# Patient Record
Sex: Female | Born: 1940
Health system: Southern US, Community
[De-identification: ages and names within clinical notes are randomized; demographics above are authoritative.]

## PROBLEM LIST (undated history)

## (undated) DIAGNOSIS — R0600 Dyspnea, unspecified: Secondary | ICD-10-CM

## (undated) DIAGNOSIS — I10 Essential (primary) hypertension: Secondary | ICD-10-CM

## (undated) DIAGNOSIS — J189 Pneumonia, unspecified organism: Secondary | ICD-10-CM

## (undated) DIAGNOSIS — T8859XA Other complications of anesthesia, initial encounter: Secondary | ICD-10-CM

## (undated) DIAGNOSIS — R0609 Other forms of dyspnea: Secondary | ICD-10-CM

## (undated) DIAGNOSIS — T4145XA Adverse effect of unspecified anesthetic, initial encounter: Secondary | ICD-10-CM

## (undated) DIAGNOSIS — K219 Gastro-esophageal reflux disease without esophagitis: Secondary | ICD-10-CM

## (undated) DIAGNOSIS — K589 Irritable bowel syndrome without diarrhea: Secondary | ICD-10-CM

## (undated) DIAGNOSIS — R0789 Other chest pain: Secondary | ICD-10-CM

## (undated) DIAGNOSIS — Z8489 Family history of other specified conditions: Secondary | ICD-10-CM

## (undated) DIAGNOSIS — M1990 Unspecified osteoarthritis, unspecified site: Secondary | ICD-10-CM

## (undated) DIAGNOSIS — R197 Diarrhea, unspecified: Secondary | ICD-10-CM

## (undated) DIAGNOSIS — Z87448 Personal history of other diseases of urinary system: Secondary | ICD-10-CM

## (undated) DIAGNOSIS — E119 Type 2 diabetes mellitus without complications: Secondary | ICD-10-CM

## (undated) DIAGNOSIS — R350 Frequency of micturition: Secondary | ICD-10-CM

## (undated) DIAGNOSIS — J40 Bronchitis, not specified as acute or chronic: Secondary | ICD-10-CM

## (undated) HISTORY — PX: CATARACT EXTRACTION: SUR2

## (undated) HISTORY — PX: COLONOSCOPY W/ POLYPECTOMY: SHX1380

## (undated) HISTORY — DX: Other forms of dyspnea: R06.09

## (undated) HISTORY — PX: SHOULDER SURGERY: SHX246

## (undated) HISTORY — PX: NASAL SINUS SURGERY: SHX719

## (undated) HISTORY — PX: CARPAL TUNNEL RELEASE: SHX101

## (undated) HISTORY — DX: Personal history of other diseases of urinary system: Z87.448

## (undated) HISTORY — DX: Other chest pain: R07.89

## (undated) HISTORY — PX: HERNIA REPAIR: SHX51

## (undated) HISTORY — DX: Dyspnea, unspecified: R06.00

## (undated) HISTORY — DX: Pneumonia, unspecified organism: J18.9

---

## 1998-07-27 ENCOUNTER — Emergency Department (HOSPITAL_COMMUNITY): Admission: EM | Admit: 1998-07-27 | Discharge: 1998-07-27 | Payer: Self-pay | Admitting: Emergency Medicine

## 1999-05-17 ENCOUNTER — Other Ambulatory Visit: Admission: RE | Admit: 1999-05-17 | Discharge: 1999-05-17 | Payer: Self-pay | Admitting: Obstetrics and Gynecology

## 2000-07-11 ENCOUNTER — Ambulatory Visit (HOSPITAL_BASED_OUTPATIENT_CLINIC_OR_DEPARTMENT_OTHER): Admission: RE | Admit: 2000-07-11 | Discharge: 2000-07-11 | Payer: Self-pay | Admitting: Orthopedic Surgery

## 2000-08-01 ENCOUNTER — Ambulatory Visit (HOSPITAL_BASED_OUTPATIENT_CLINIC_OR_DEPARTMENT_OTHER): Admission: RE | Admit: 2000-08-01 | Discharge: 2000-08-01 | Payer: Self-pay | Admitting: Orthopedic Surgery

## 2001-02-15 ENCOUNTER — Other Ambulatory Visit: Admission: RE | Admit: 2001-02-15 | Discharge: 2001-02-15 | Payer: Self-pay | Admitting: Internal Medicine

## 2007-05-08 ENCOUNTER — Ambulatory Visit: Payer: Self-pay | Admitting: Internal Medicine

## 2007-05-15 ENCOUNTER — Encounter: Payer: Self-pay | Admitting: Internal Medicine

## 2007-05-15 ENCOUNTER — Ambulatory Visit: Payer: Self-pay | Admitting: Internal Medicine

## 2010-12-29 ENCOUNTER — Emergency Department (HOSPITAL_BASED_OUTPATIENT_CLINIC_OR_DEPARTMENT_OTHER)
Admission: EM | Admit: 2010-12-29 | Discharge: 2010-12-29 | Disposition: A | Discharge status: Home / Self Care | Payer: Medicare Other | Attending: Emergency Medicine | Admitting: Emergency Medicine

## 2010-12-29 DIAGNOSIS — R112 Nausea with vomiting, unspecified: Secondary | ICD-10-CM | POA: Insufficient documentation

## 2010-12-29 DIAGNOSIS — R509 Fever, unspecified: Secondary | ICD-10-CM | POA: Insufficient documentation

## 2010-12-29 DIAGNOSIS — I10 Essential (primary) hypertension: Secondary | ICD-10-CM | POA: Insufficient documentation

## 2010-12-29 DIAGNOSIS — R197 Diarrhea, unspecified: Secondary | ICD-10-CM | POA: Insufficient documentation

## 2010-12-29 LAB — COMPREHENSIVE METABOLIC PANEL
ALT: 28 U/L (ref 0–35)
AST: 23 U/L (ref 0–37)
Albumin: 4.7 g/dL (ref 3.5–5.2)
CO2: 25 mEq/L (ref 19–32)
Calcium: 9.7 mg/dL (ref 8.4–10.5)
Chloride: 106 mEq/L (ref 96–112)
Creatinine, Ser: 0.9 mg/dL (ref 0.4–1.2)
GFR calc Af Amer: >60 mL/min (ref >60)
GFR calc non Af Amer: >60 mL/min (ref >60)
Sodium: 145 mEq/L (ref 135–145)
Total Bilirubin: 0.8 mg/dL (ref 0.3–1.2)

## 2010-12-29 LAB — URINALYSIS, ROUTINE W REFLEX MICROSCOPIC
Bilirubin Urine: NEGATIVE
Hgb urine dipstick: NEGATIVE
Ketones, ur: NEGATIVE mg/dL
Leukocytes, UA: NEGATIVE
Nitrite: NEGATIVE
Protein, ur: 30 mg/dL — AB
Specific Gravity, Urine: 1.035 — ABNORMAL HIGH (ref 1.005–1.030)
Urine Glucose, Fasting: NEGATIVE mg/dL
Urobilinogen, UA: 0.2 mg/dL (ref 0.0–1.0)
pH: 6 (ref 5.0–8.0)

## 2010-12-29 LAB — CBC
HCT: 43.6 % (ref 36.0–46.0)
Hemoglobin: 14.9 g/dL (ref 12.0–15.0)
MCH: 29.1 pg (ref 26.0–34.0)
MCHC: 34.2 g/dL (ref 30.0–36.0)
MCV: 85.2 fL (ref 78.0–100.0)
Platelets: 152 K/uL (ref 150–400)
RBC: 5.12 MIL/uL — ABNORMAL HIGH (ref 3.87–5.11)
RDW: 14.3 % (ref 11.5–15.5)
WBC: 8.6 K/uL (ref 4.0–10.5)

## 2010-12-29 LAB — DIFFERENTIAL
Basophils Absolute: 0 K/uL (ref 0.0–0.1)
Basophils Relative: 0 % (ref 0–1)
Eosinophils Absolute: 0 K/uL (ref 0.0–0.7)
Eosinophils Relative: 1 % (ref 0–5)
Lymphocytes Relative: 2 % — ABNORMAL LOW (ref 12–46)
Lymphs Abs: 0.2 K/uL — ABNORMAL LOW (ref 0.7–4.0)
Monocytes Absolute: 0.2 K/uL (ref 0.1–1.0)
Monocytes Relative: 3 % (ref 3–12)
Neutro Abs: 8.1 K/uL — ABNORMAL HIGH (ref 1.7–7.7)
Neutrophils Relative %: 94 % — ABNORMAL HIGH (ref 43–77)

## 2010-12-29 LAB — URINE MICROSCOPIC-ADD ON

## 2011-04-01 NOTE — Op Note (Signed)
[place]  Patient:    Robin Vaughn, Robin Vaughn                      MRN: [id number] Proc. Date: 08/01/00 Adm. Date:  [id number] Attending:  Susa Day                           Operative Report  PREOPERATIVE DIAGNOSIS:  Entrapment neuropathy, median nerve, left carpal tunnel.  POSTOPERATIVE DIAGNOSIS:  Entrapment neuropathy, median nerve, left carpal tunnel.  OPERATION:  Release of left transverse carpal ligament.  SURGEON:  Katy Fitch. Sypher, Montez Hageman., M.D.  ASSISTANT:  Jonni Sanger, P.A.  ANESTHESIA:  Left proximal forearm IV regional.  SUPERVISING ANESTHESIOLOGIST:  Halford Decamp, M.D.  INDICATIONS:  [name] is a 70 year old woman who has had longstanding numbness in the median distribution on the right.  Due to failure to respond to non-operative measures, she is brought to the operating room at this time, anticipating release of her right transverse carpal ligament.  DESCRIPTION OF PROCEDURE:  [name] is brought to the operating room and placed in the supine position on the operating table.  Following placement of an IV regional block, the left arm was prepped with Betadine soap and solution and sterilely draped.  When anesthesia was satisfactory, the procedure commenced with a short incision in the line of the ring finger in the palm.  The subcutaneous tissues were carefully divided around the palmar fascia.  They were split longitudinally to reveal the common sensory branches of the medial nerve.  The common sensory branches were followed back to the transverse carpal ligament where the medial nerve proper was separated from the ligament.  The ligament was then released with scissors on its ulnar border, extending to the distal forearm.  The volar forearm fascia was also released with scissors subcutaneously.  This widened opened the carpal canal.  No masses or other predicaments were noted.  Bleeding  points along the margin of the released ligament were electrocauterized with bipolar current followed by repair of the skin with intradermal 3-0 Prolene suture.  A compressive dressing was applied with a volar plaster splint with the fingers in 5 degrees of dorsiflexion.  AFTERCARE:  [name] was given a prescription for Percocet 5/325 mg one to two tablets p.o. q.4-6h. p.r.n. pain.  She will return to our office for follow-up in approximately 7-10 days for suture removal and instruction in an exercise program.  She is advised to keep her dressing dry. DD:  08/02/99 TD:  08/02/00 Job: [id number]

## 2012-02-28 ENCOUNTER — Encounter: Payer: Self-pay | Admitting: Internal Medicine

## 2012-03-04 ENCOUNTER — Emergency Department (HOSPITAL_BASED_OUTPATIENT_CLINIC_OR_DEPARTMENT_OTHER)
Admission: EM | Admit: 2012-03-04 | Discharge: 2012-03-05 | Disposition: A | Discharge status: Home / Self Care | Payer: Medicare Other | Attending: Emergency Medicine | Admitting: Emergency Medicine

## 2012-03-04 ENCOUNTER — Emergency Department (HOSPITAL_COMMUNITY): Payer: Medicare Other

## 2012-03-04 ENCOUNTER — Encounter (HOSPITAL_BASED_OUTPATIENT_CLINIC_OR_DEPARTMENT_OTHER): Payer: Self-pay | Admitting: *Deleted

## 2012-03-04 ENCOUNTER — Emergency Department (HOSPITAL_BASED_OUTPATIENT_CLINIC_OR_DEPARTMENT_OTHER): Payer: Medicare Other

## 2012-03-04 DIAGNOSIS — R55 Syncope and collapse: Secondary | ICD-10-CM | POA: Insufficient documentation

## 2012-03-04 DIAGNOSIS — R112 Nausea with vomiting, unspecified: Secondary | ICD-10-CM | POA: Insufficient documentation

## 2012-03-04 DIAGNOSIS — R42 Dizziness and giddiness: Secondary | ICD-10-CM

## 2012-03-04 DIAGNOSIS — I10 Essential (primary) hypertension: Secondary | ICD-10-CM | POA: Insufficient documentation

## 2012-03-04 HISTORY — DX: Essential (primary) hypertension: I10

## 2012-03-04 HISTORY — DX: Bronchitis, not specified as acute or chronic: J40

## 2012-03-04 LAB — URINALYSIS, ROUTINE W REFLEX MICROSCOPIC
Bilirubin Urine: NEGATIVE
Hgb urine dipstick: NEGATIVE
Ketones, ur: NEGATIVE mg/dL
Nitrite: NEGATIVE
Specific Gravity, Urine: 1.027 (ref 1.005–1.030)
pH: 5 (ref 5.0–8.0)

## 2012-03-04 LAB — PROTIME-INR
INR: 0.91 (ref 0.00–1.49)
Prothrombin Time: 12.5 seconds (ref 11.6–15.2)

## 2012-03-04 LAB — CBC
Hemoglobin: 13.9 g/dL (ref 12.0–15.0)
MCH: 29.6 pg (ref 26.0–34.0)
MCHC: 34.4 g/dL (ref 30.0–36.0)
MCV: 86.1 fL (ref 78.0–100.0)
RBC: 4.69 MIL/uL (ref 3.87–5.11)

## 2012-03-04 LAB — COMPREHENSIVE METABOLIC PANEL
ALT: 18 U/L (ref 0–35)
BUN: 17 mg/dL (ref 6–23)
CO2: 29 mEq/L (ref 19–32)
Calcium: 10.1 mg/dL (ref 8.4–10.5)
Creatinine, Ser: 0.8 mg/dL (ref 0.50–1.10)
GFR calc Af Amer: 85 mL/min — ABNORMAL LOW (ref >90)
GFR calc non Af Amer: 74 mL/min — ABNORMAL LOW (ref >90)
Glucose, Bld: 158 mg/dL — ABNORMAL HIGH (ref 70–99)
Total Protein: 7.5 g/dL (ref 6.0–8.3)

## 2012-03-04 LAB — APTT: aPTT: 26 seconds (ref 24–37)

## 2012-03-04 MED ORDER — MECLIZINE HCL 50 MG PO TABS: 50.0000 mg | ORAL_TABLET | Freq: Three times a day (TID) | ORAL | Status: AC | PRN

## 2012-03-04 MED ORDER — ONDANSETRON HCL 4 MG/2ML IJ SOLN: 4.0000 mg | Freq: Once | INTRAMUSCULAR | Status: DC

## 2012-03-04 MED ORDER — ONDANSETRON HCL 4 MG/2ML IJ SOLN
4.0000 mg | Freq: Once | INTRAMUSCULAR | Status: AC
  Administered 2012-03-04: 4 mg via INTRAVENOUS

## 2012-03-04 MED ORDER — ONDANSETRON HCL 4 MG/2ML IJ SOLN
INTRAMUSCULAR | Status: AC
  Administered 2012-03-04: 4 mg via INTRAVENOUS
  Filled 2012-03-04: qty 2

## 2012-03-04 MED ORDER — ONDANSETRON HCL 4 MG/2ML IJ SOLN
4.0000 mg | Freq: Once | INTRAMUSCULAR | Status: AC
  Administered 2012-03-04: 4 mg via INTRAVENOUS
  Filled 2012-03-04: qty 2

## 2012-03-04 NOTE — ED Notes (Signed)
Pt tolerated Lowell Guitar, denies any need for Zofran at this time.

## 2012-03-04 NOTE — ED Notes (Signed)
Pt seen examined.  Was sent from [facility] with dizziness/vomiting to get CT scan.  Pt presented with sudden onset of dizziness while driving, associated with vomiting.  No headache.  No slurred speech, vision changes, or other neuro deficits.  Was reproduced with Dix-Hall-Pike maneuver.  Pt is feeling much better now, ambulated without problem.  Discussed with neuro, Dr. Roseanne Reno who feels that with symptoms is likely peripheral and does not need further TIA work-up.  Will d/c to f/u with her PMD, Dr. Hope Pigeon, MD 03/05/12 0000

## 2012-03-04 NOTE — ED Provider Notes (Signed)
History   This chart was scribed for Robin Chick, MD scribed by Magnus Sinning. The patient was seen in room MH06/MH06 seen at 19:45.   CSN: [id number]  Arrival date & time 03/04/12  1738   First MD Initiated Contact with Patient 03/04/12 1917      Chief Complaint  Patient presents with  . Dizziness  . Emesis    (Consider location/radiation/quality/duration/timing/severity/associated sxs/prior treatment) HPI Robin Vaughn is a 71 y.o. female who presents to the Emergency Department complaining of  constant severe dizziness described as an unsteady sensation, with associated n/v, onset 3 hours ago. Pt explains she started feeling "funny" when she was heading to the store, adding that she felt unsteady. Says she sat down in her car when she started feeling nauseous and then began vomiting. She says her husband was called and that she still continued to vomit. Reports that when she finally got up to get into his car that she still felt unsteady again and that she was unable to get balance.  Per relative, she has similar dizziness sxs over Easter weekend and says that episode lasted an hour, but  was relieved following dinner. Says she is currently feeling a little dizzy.  Past Medical History  Diagnosis Date  . Hypertension   . Bronchitis     Past Surgical History  Procedure Date  . Hernia repair   . Carpal tunnel release   . Cataract extraction   . Shoulder surgery     History reviewed. No pertinent family history.  History  Substance Use Topics  . Smoking status: Never Smoker   . Smokeless tobacco: Never Used  . Alcohol Use: Yes     occasional    Review of Systems 10 Systems reviewed and are negative for acute change except as noted in the HPI. Allergies  Penicillins cross reactors  Home Medications   Current Outpatient Rx  Name Route Sig Dispense Refill  . AMLODIPINE BESY-BENAZEPRIL HCL 5-10 MG PO CAPS Oral Take 1 capsule by mouth daily.    . ASPIRIN 81 MG  PO TABS Oral Take 81 mg by mouth daily.    Marland Kitchen BIOTIN PO Oral Take 1 tablet by mouth daily.    Marland Kitchen CALCIUM-VITAMIN D-VITAMIN K [id number] MG-UNT-MCG PO CHEW Oral Chew 1 each by mouth 2 (two) times daily.    Marland Kitchen CINNAMON PO Oral Take 1 capsule by mouth 3 (three) times daily.    Marland Kitchen CIPROFLOXACIN-DEXAMETHASONE 0.3-0.1 % OT SUSP Both Ears Place 4 drops into both ears 2 (two) times daily.    . CO Q-10 PO Oral Take 1 tablet by mouth daily.    Marland Kitchen RA FLAX SEED OIL PO Oral Take 1 capsule by mouth daily.    . ADULT MULTIVITAMIN W/MINERALS CH Oral Take 1 tablet by mouth daily.    Marland Kitchen PROBIOTIC FORMULA PO Oral Take 1 tablet by mouth daily.    . MECLIZINE HCL 50 MG PO TABS Oral Take 1 tablet (50 mg total) by mouth 3 (three) times daily as needed. 30 tablet 0    BP 127/69  Pulse 74  Temp 98 F (36.7 C)  Resp 20  SpO2 97%  Physical Exam  Nursing note and vitals reviewed. Constitutional: She is oriented to person, place, and time. She appears well-developed and well-nourished. No distress.       Active vomiting  HENT:  Head: Normocephalic and atraumatic.  Eyes: EOM are normal. Pupils are equal, round, and reactive to light.  Neck: Neck  supple. No tracheal deviation present.  Cardiovascular: Normal rate.   Pulmonary/Chest: Effort normal. No respiratory distress.  Abdominal: Soft. She exhibits no distension.  Musculoskeletal: Normal range of motion. She exhibits no edema.  Neurological: She is alert and oriented to person, place, and time. No sensory deficit.       Dix-Hall-Pike test was positive to the right Mild horizontal nystagmus   Skin: Skin is warm and dry.  Psychiatric: She has a normal mood and affect. Her behavior is normal.    ED Course  Procedures (including critical care time) DIAGNOSTIC STUDIES: Oxygen Saturation is 97% on room air, normal by my interpretation.    COORDINATION OF CARE: Medication Orders 1830: ZOFRAN injection 4 mg Once  20:00: ZOFRAN injection 4 mg Once   20:15:  Physician contacted [facility] to discuss patient transfer. CT Scanner is down. Patient needs a CT.  8:17 PM [facility] is on the way for transport.  D/w Dr. Fredderick Phenix in ED, she is aware of patient coming to ED to complete her workup including CT scan and further treatment.  Also d/w Dr. Roseanne Reno, neurology, he is in agreement that this is not a code stroke patient, but does need [facility] for [facility] and further workup.    Labs Reviewed  COMPREHENSIVE METABOLIC PANEL - Abnormal; Notable for the following:    Potassium 3.2 (*)    Glucose, Bld 158 (*)    Total Bilirubin 0.2 (*)    GFR calc non Af Amer 74 (*)    GFR calc Af Amer 85 (*)    All other components within normal limits  URINALYSIS, ROUTINE W REFLEX MICROSCOPIC  CBC  PROTIME-INR  APTT   No results found.   1. Vertigo       MDM  Patient presenting with acute onset of dizziness and feeling unsteady on her feet. She does not describe a specific sensation of spinning but does feel that when she was ambulating her feet were unsteady underneath her and she also had acute onset of nausea and vomiting as well. She was seen in the emergency department by me and has her CT scan her with nonfunctional for today she was urgently transferred to [facility] to proceed with the rest of her workup including head CT. She and her daughter were both updated and in agreement with this plan. Neurology was consulted by telephone and the emergency physician at [facility] was notified as well by telephone of the transfer. Patient was transferred by [facility].  I personally performed the services described in this documentation, which was scribed in my presence. The recorded information has been reviewed and considered.         Robin Chick, MD 03/08/12 1409

## 2012-03-04 NOTE — ED Notes (Signed)
Stated some nausea but denies any pain.

## 2012-03-04 NOTE — ED Notes (Signed)
Pt' s family member reports that pt drove to [employer] became dizzy and started vomiting and called him and he brought her to ED. Pt vomiting in triage- states she feels dizzy- is currently on antibiotic ear drops

## 2012-03-04 NOTE — Discharge Instructions (Signed)

## 2012-04-06 ENCOUNTER — Emergency Department (HOSPITAL_BASED_OUTPATIENT_CLINIC_OR_DEPARTMENT_OTHER)
Admission: EM | Admit: 2012-04-06 | Discharge: 2012-04-07 | Disposition: A | Discharge status: Home / Self Care | Payer: Medicare Other | Attending: Emergency Medicine | Admitting: Emergency Medicine

## 2012-04-06 ENCOUNTER — Encounter (HOSPITAL_BASED_OUTPATIENT_CLINIC_OR_DEPARTMENT_OTHER): Payer: Self-pay | Admitting: *Deleted

## 2012-04-06 DIAGNOSIS — K219 Gastro-esophageal reflux disease without esophagitis: Secondary | ICD-10-CM | POA: Insufficient documentation

## 2012-04-06 DIAGNOSIS — R079 Chest pain, unspecified: Secondary | ICD-10-CM | POA: Insufficient documentation

## 2012-04-06 DIAGNOSIS — Z79899 Other long term (current) drug therapy: Secondary | ICD-10-CM | POA: Insufficient documentation

## 2012-04-06 DIAGNOSIS — R0789 Other chest pain: Secondary | ICD-10-CM

## 2012-04-06 DIAGNOSIS — I10 Essential (primary) hypertension: Secondary | ICD-10-CM | POA: Insufficient documentation

## 2012-04-06 DIAGNOSIS — Z7982 Long term (current) use of aspirin: Secondary | ICD-10-CM | POA: Insufficient documentation

## 2012-04-06 HISTORY — DX: Gastro-esophageal reflux disease without esophagitis: K21.9

## 2012-04-06 NOTE — ED Notes (Signed)
States 20 mins ago while eating she felt severe indigestion pain that went away with Nitro x 1 SL. She took ASA 81 mg x 3. Pain on arrival feels like pressure. She is anxious. Monitor NSR. EKG done on arrival.

## 2012-04-06 NOTE — ED Notes (Signed)
Pt reports developing epigastric/midsternal chest pressure post eating dinner this evening. Pt states that she called friend and friend gave her one nitroglycerin tablet en route to the ER. Pt states chest pain/pressure was a 3, now a 0 upon arrival to the ER. Pt reports history of GERD, but states that she hasn't had a "flare up" or taken meds for it over a year. Pt appears somewhat anxious, though denies anxiety. Pt placed on cardiac monitor. Heart rate 90-2 and NSR. Friend at bedside.

## 2012-04-07 ENCOUNTER — Emergency Department (HOSPITAL_BASED_OUTPATIENT_CLINIC_OR_DEPARTMENT_OTHER): Payer: Medicare Other

## 2012-04-07 LAB — DIFFERENTIAL
Lymphocytes Relative: 27 % (ref 12–46)
Lymphs Abs: 2 10*3/uL (ref 0.7–4.0)
Monocytes Relative: 8 % (ref 3–12)
Neutro Abs: 4.4 10*3/uL (ref 1.7–7.7)
Neutrophils Relative %: 59 % (ref 43–77)

## 2012-04-07 LAB — CBC
Hemoglobin: 13.7 g/dL (ref 12.0–15.0)
MCH: 29.5 pg (ref 26.0–34.0)
RBC: 4.64 MIL/uL (ref 3.87–5.11)

## 2012-04-07 LAB — COMPREHENSIVE METABOLIC PANEL
Alkaline Phosphatase: 84 U/L (ref 39–117)
BUN: 21 mg/dL (ref 6–23)
Chloride: 103 mEq/L (ref 96–112)
GFR calc Af Amer: 65 mL/min — ABNORMAL LOW (ref >90)
Glucose, Bld: 122 mg/dL — ABNORMAL HIGH (ref 70–99)
Potassium: 3.9 mEq/L (ref 3.5–5.1)
Total Bilirubin: 0.2 mg/dL — ABNORMAL LOW (ref 0.3–1.2)

## 2012-04-07 LAB — LIPASE, BLOOD: Lipase: 44 U/L (ref 11–59)

## 2012-04-07 LAB — CARDIAC PANEL(CRET KIN+CKTOT+MB+TROPI): Relative Index: INVALID (ref 0.0–2.5)

## 2012-04-07 MED ORDER — GI COCKTAIL ~~LOC~~
30.0000 mL | Freq: Once | ORAL | Status: AC
  Administered 2012-04-07: 30 mL via ORAL
  Filled 2012-04-07: qty 30

## 2012-04-07 NOTE — Discharge Instructions (Signed)
Chest Pain (Nonspecific) It is often hard to give a specific diagnosis for the cause of chest pain. There is always a chance that your pain could be related to something serious, such as a heart attack or a blood clot in the lungs. You need to follow up with your caregiver for further evaluation. CAUSES   Heartburn.   Pneumonia or bronchitis.   Anxiety or stress.   Inflammation around your heart (pericarditis) or lung (pleuritis or pleurisy).   A blood clot in the lung.   A collapsed lung (pneumothorax). It can develop suddenly on its own (spontaneous pneumothorax) or from injury (trauma) to the chest.   Shingles infection (herpes zoster virus).  The chest wall is composed of bones, muscles, and cartilage. Any of these can be the source of the pain.  The bones can be bruised by injury.   The muscles or cartilage can be strained by coughing or overwork.   The cartilage can be affected by inflammation and become sore (costochondritis).  DIAGNOSIS  Lab tests or other studies, such as X-rays, electrocardiography, stress testing, or cardiac imaging, may be needed to find the cause of your pain.  TREATMENT   Treatment depends on what may be causing your chest pain. Treatment may include:   Acid blockers for heartburn.   Anti-inflammatory medicine.   Pain medicine for inflammatory conditions.   Antibiotics if an infection is present.   You may be advised to change lifestyle habits. This includes stopping smoking and avoiding alcohol, caffeine, and chocolate.   You may be advised to keep your head raised (elevated) when sleeping. This reduces the chance of acid going backward from your stomach into your esophagus.   Most of the time, nonspecific chest pain will improve within 2 to 3 days with rest and mild pain medicine.  HOME CARE INSTRUCTIONS   If antibiotics were prescribed, take your antibiotics as directed. Finish them even if you start to feel better.   For the next few  days, avoid physical activities that bring on chest pain. Continue physical activities as directed.   Do not smoke.   Avoid drinking alcohol.   Only take over-the-counter or prescription medicine for pain, discomfort, or fever as directed by your caregiver.   Follow your caregiver's suggestions for further testing if your chest pain does not go away.   Keep any follow-up appointments you made. If you do not go to an appointment, you could develop lasting (chronic) problems with pain. If there is any problem keeping an appointment, you must call to reschedule.  SEEK MEDICAL CARE IF:   You think you are having problems from the medicine you are taking. Read your medicine instructions carefully.   Your chest pain does not go away, even after treatment.   You develop a rash with blisters on your chest.  SEEK IMMEDIATE MEDICAL CARE IF:   You have increased chest pain or pain that spreads to your arm, neck, jaw, back, or abdomen.   You develop shortness of breath, an increasing cough, or you are coughing up blood.   You have severe back or abdominal pain, feel nauseous, or vomit.   You develop severe weakness, fainting, or chills.   You have a fever.  THIS IS AN EMERGENCY. Do not wait to see if the pain will go away. Get medical help at once. Call your local emergency services (911 in [place] Do not drive yourself to the hospital. MAKE SURE YOU:   Understand these instructions.     Will watch your condition.   Will get help right away if you are not doing well or get worse.  Document Released: 08/10/2005 Document Revised: 10/20/2011 Document Reviewed: 06/05/2008 [employer] Patient Information 2012 [employer]. 

## 2012-04-07 NOTE — ED Provider Notes (Signed)
History     CSN: [id number]  Arrival date & time 04/06/12  2253   First MD Initiated Contact with Patient 04/06/12 2347      Chief Complaint  Patient presents with  . Chest Pain    (Consider location/radiation/quality/duration/timing/severity/associated sxs/prior treatment) HPI Comments: Patient presents tonight with an indigestion feeling to her epigastric area. It started about 3 hours ago while she was eating dinner. She had prior to that been exercising, was walking/jogging for 3 miles. . It was mostly walking, but she had some intermittent jogging, and she had no symptoms at all during that,  She went home and took a shower and then when she sat down to eat dinner, while she was eating some steak, she felt pressure feeling in her epigastric area. She said it felt uncomfortable and like there was a gas bubble. There. It was nonradiating. She denies any shortness of breath. She denies any nausea or vomiting. She denies any diaphoresis. Denies a history of heart problems in the past. She has had a stress test in the past, but it's been several years. She does see a cardiologist, Dr. Gery Pray manages her hypertension. However, she was recently taken off her antihypertensive medicine because her blood pressure was doing okay so, currently. She's not taking any antihypertensive medication. She did take an aspirin and one of her friend's nitroglycerin tablets and as she was walking out to the car. Her symptoms subsided. She still has some minor discomfort in her epigastric area  Patient is a 71 y.o. female presenting with chest pain. The history is provided by the patient.  Chest Pain Pertinent negatives for primary symptoms include no fever, no fatigue, no shortness of breath, no cough, no abdominal pain, no nausea, no vomiting and no dizziness.  Pertinent negatives for associated symptoms include no diaphoresis, no numbness and no weakness.     Past Medical History  Diagnosis Date  .  Hypertension   . Bronchitis   . GERD (gastroesophageal reflux disease)     Past Surgical History  Procedure Date  . Hernia repair   . Carpal tunnel release   . Cataract extraction   . Shoulder surgery     No family history on file.  History  Substance Use Topics  . Smoking status: Never Smoker   . Smokeless tobacco: Never Used  . Alcohol Use: Yes     occasional    OB History    Grav Para Term Preterm Abortions TAB SAB Ect Mult Living                  Review of Systems  Constitutional: Negative for fever, chills, diaphoresis and fatigue.  HENT: Negative for congestion, rhinorrhea and sneezing.   Eyes: Negative.   Respiratory: Negative for cough, chest tightness and shortness of breath.   Cardiovascular: Positive for chest pain. Negative for leg swelling.  Gastrointestinal: Negative for nausea, vomiting, abdominal pain, diarrhea and blood in stool.  Genitourinary: Negative for frequency, hematuria, flank pain and difficulty urinating.  Musculoskeletal: Negative for back pain and arthralgias.  Skin: Negative for rash.  Neurological: Negative for dizziness, speech difficulty, weakness, numbness and headaches.    Allergies  Penicillins cross reactors  Home Medications   Current Outpatient Rx  Name Route Sig Dispense Refill  . AMLODIPINE BESY-BENAZEPRIL HCL 5-10 MG PO CAPS Oral Take 1 capsule by mouth daily.    . ASPIRIN 81 MG PO TABS Oral Take 81 mg by mouth daily.    Marland Kitchen BIOTIN  PO Oral Take 1 tablet by mouth daily.    Marland Kitchen CALCIUM-VITAMIN D-VITAMIN K [id number] MG-UNT-MCG PO CHEW Oral Chew 1 each by mouth 2 (two) times daily.    Marland Kitchen CINNAMON PO Oral Take 1 capsule by mouth 3 (three) times daily.    Marland Kitchen CIPROFLOXACIN-DEXAMETHASONE 0.3-0.1 % OT SUSP Both Ears Place 4 drops into both ears 2 (two) times daily.    . CO Q-10 PO Oral Take 1 tablet by mouth daily.    Marland Kitchen RA FLAX SEED OIL PO Oral Take 1 capsule by mouth daily.    . ADULT MULTIVITAMIN W/MINERALS CH Oral Take 1 tablet  by mouth daily.    Marland Kitchen PROBIOTIC FORMULA PO Oral Take 1 tablet by mouth daily.      BP 135/66  Pulse 88  Temp(Src) 98.3 F (36.8 C) (Oral)  Resp 20  SpO2 95%  Physical Exam  Constitutional: She is oriented to person, place, and time. She appears well-developed and well-nourished.  HENT:  Head: Normocephalic and atraumatic.  Eyes: Pupils are equal, round, and reactive to light.  Neck: Normal range of motion. Neck supple.  Cardiovascular: Normal rate, regular rhythm and normal heart sounds.   Pulmonary/Chest: Effort normal and breath sounds normal. No respiratory distress. She has no wheezes. She has no rales. She exhibits no tenderness.  Abdominal: Soft. Bowel sounds are normal. There is no tenderness. There is no rebound and no guarding.  Musculoskeletal: Normal range of motion. She exhibits no edema.  Lymphadenopathy:    She has no cervical adenopathy.  Neurological: She is alert and oriented to person, place, and time.  Skin: Skin is warm and dry. No rash noted.  Psychiatric: She has a normal mood and affect.    ED Course  Procedures (including critical care time)   Date: 04/07/2012  Rate: 89  Rhythm: normal sinus rhythm  QRS Axis: normal  Intervals: normal  ST/T Wave abnormalities: normal  Conduction Disutrbances:none  Narrative Interpretation:   Old EKG Reviewed: unavailable  Results for orders placed during the hospital encounter of 04/06/12  CBC      Component Value Range   WBC 7.4  4.0 - 10.5 (K/uL)   RBC 4.64  3.87 - 5.11 (MIL/uL)   Hemoglobin 13.7  12.0 - 15.0 (g/dL)   HCT 11.9  14.7 - 82.9 (%)   MCV 86.4  78.0 - 100.0 (fL)   MCH 29.5  26.0 - 34.0 (pg)   MCHC 34.2  30.0 - 36.0 (g/dL)   RDW 56.2  13.0 - 86.5 (%)   Platelets 140 (*) 150 - 400 (K/uL)  DIFFERENTIAL      Component Value Range   Neutrophils Relative 59  43 - 77 (%)   Neutro Abs 4.4  1.7 - 7.7 (K/uL)   Lymphocytes Relative 27  12 - 46 (%)   Lymphs Abs 2.0  0.7 - 4.0 (K/uL)   Monocytes  Relative 8  3 - 12 (%)   Monocytes Absolute 0.6  0.1 - 1.0 (K/uL)   Eosinophils Relative 6 (*) 0 - 5 (%)   Eosinophils Absolute 0.5  0.0 - 0.7 (K/uL)   Basophils Relative 0  0 - 1 (%)   Basophils Absolute 0.0  0.0 - 0.1 (K/uL)  COMPREHENSIVE METABOLIC PANEL      Component Value Range   Sodium 140  135 - 145 (mEq/L)   Potassium 3.9  3.5 - 5.1 (mEq/L)   Chloride 103  96 - 112 (mEq/L)   CO2 27  19 -  32 (mEq/L)   Glucose, Bld 122 (*) 70 - 99 (mg/dL)   BUN 21  6 - 23 (mg/dL)   Creatinine, Ser 1.61  0.50 - 1.10 (mg/dL)   Calcium 9.3  8.4 - 09.6 (mg/dL)   Total Protein 7.0  6.0 - 8.3 (g/dL)   Albumin 4.1  3.5 - 5.2 (g/dL)   AST 24  0 - 37 (U/L)   ALT 19  0 - 35 (U/L)   Alkaline Phosphatase 84  39 - 117 (U/L)   Total Bilirubin 0.2 (*) 0.3 - 1.2 (mg/dL)   GFR calc non Af Amer 56 (*) >90 (mL/min)   GFR calc Af Amer 65 (*) >90 (mL/min)  LIPASE, BLOOD      Component Value Range   Lipase 44  11 - 59 (U/L)  CARDIAC PANEL(CRET KIN+CKTOT+MB+TROPI)      Component Value Range   Total CK 89  7 - 177 (U/L)   CK, MB 2.8  0.3 - 4.0 (ng/mL)   Troponin I <0.30  <0.30 (ng/mL)   Relative Index RELATIVE INDEX IS INVALID  0.0 - 2.5   TROPONIN I      Component Value Range   Troponin I <0.30  <0.30 (ng/mL)   No results found.    1. Chest pain, atypical       MDM  Pt is pain free after GI cocktail.  Feel that this is not likely to be cardiac, as pt walked/ran 3 miles without having any symptoms.  Spoke with Dr. Herbie Baltimore on call with [facility] who recommended doing a repeat troponin and if negative, they will f/u pt in office to reevaluate.        Rolan Bucco, MD 04/07/12 [phone]

## 2012-12-06 ENCOUNTER — Emergency Department (HOSPITAL_BASED_OUTPATIENT_CLINIC_OR_DEPARTMENT_OTHER)
Admission: EM | Admit: 2012-12-06 | Discharge: 2012-12-06 | Disposition: A | Discharge status: Home / Self Care | Payer: Medicare Other | Attending: Emergency Medicine | Admitting: Emergency Medicine

## 2012-12-06 ENCOUNTER — Encounter (HOSPITAL_BASED_OUTPATIENT_CLINIC_OR_DEPARTMENT_OTHER): Payer: Self-pay | Admitting: *Deleted

## 2012-12-06 DIAGNOSIS — R197 Diarrhea, unspecified: Secondary | ICD-10-CM | POA: Insufficient documentation

## 2012-12-06 DIAGNOSIS — I10 Essential (primary) hypertension: Secondary | ICD-10-CM | POA: Insufficient documentation

## 2012-12-06 DIAGNOSIS — Z7982 Long term (current) use of aspirin: Secondary | ICD-10-CM | POA: Insufficient documentation

## 2012-12-06 DIAGNOSIS — H9319 Tinnitus, unspecified ear: Secondary | ICD-10-CM | POA: Insufficient documentation

## 2012-12-06 DIAGNOSIS — R112 Nausea with vomiting, unspecified: Secondary | ICD-10-CM | POA: Insufficient documentation

## 2012-12-06 DIAGNOSIS — Z8709 Personal history of other diseases of the respiratory system: Secondary | ICD-10-CM | POA: Insufficient documentation

## 2012-12-06 DIAGNOSIS — Z79899 Other long term (current) drug therapy: Secondary | ICD-10-CM | POA: Insufficient documentation

## 2012-12-06 DIAGNOSIS — Z8719 Personal history of other diseases of the digestive system: Secondary | ICD-10-CM | POA: Insufficient documentation

## 2012-12-06 DIAGNOSIS — H81399 Other peripheral vertigo, unspecified ear: Secondary | ICD-10-CM | POA: Insufficient documentation

## 2012-12-06 LAB — CBC WITH DIFFERENTIAL/PLATELET
Eosinophils Absolute: 0.1 10*3/uL (ref 0.0–0.7)
Hemoglobin: 13.9 g/dL (ref 12.0–15.0)
Lymphocytes Relative: 10 % — ABNORMAL LOW (ref 12–46)
Lymphs Abs: 0.8 10*3/uL (ref 0.7–4.0)
MCH: 29 pg (ref 26.0–34.0)
MCV: 86.2 fL (ref 78.0–100.0)
Monocytes Relative: 4 % (ref 3–12)
Neutrophils Relative %: 84 % — ABNORMAL HIGH (ref 43–77)
RBC: 4.79 MIL/uL (ref 3.87–5.11)
WBC: 7.5 10*3/uL (ref 4.0–10.5)

## 2012-12-06 LAB — COMPREHENSIVE METABOLIC PANEL
ALT: 19 U/L (ref 0–35)
Alkaline Phosphatase: 74 U/L (ref 39–117)
BUN: 18 mg/dL (ref 6–23)
CO2: 28 mEq/L (ref 19–32)
GFR calc Af Amer: 85 mL/min — ABNORMAL LOW (ref >90)
GFR calc non Af Amer: 73 mL/min — ABNORMAL LOW (ref >90)
Glucose, Bld: 129 mg/dL — ABNORMAL HIGH (ref 70–99)
Potassium: 3.7 mEq/L (ref 3.5–5.1)
Total Bilirubin: 0.3 mg/dL (ref 0.3–1.2)
Total Protein: 7.7 g/dL (ref 6.0–8.3)

## 2012-12-06 LAB — URINALYSIS, ROUTINE W REFLEX MICROSCOPIC
Bilirubin Urine: NEGATIVE
Ketones, ur: 15 mg/dL — AB
Leukocytes, UA: NEGATIVE
Nitrite: NEGATIVE
Protein, ur: NEGATIVE mg/dL
Urobilinogen, UA: 0.2 mg/dL (ref 0.0–1.0)
pH: 7 (ref 5.0–8.0)

## 2012-12-06 MED ORDER — ONDANSETRON HCL 4 MG/2ML IJ SOLN
4.0000 mg | Freq: Once | INTRAMUSCULAR | Status: AC
  Administered 2012-12-06: 4 mg via INTRAVENOUS
  Filled 2012-12-06: qty 2

## 2012-12-06 MED ORDER — SODIUM CHLORIDE 0.9 % IV BOLUS (SEPSIS)
500.0000 mL | Freq: Once | INTRAVENOUS | Status: AC
  Administered 2012-12-06: 500 mL via INTRAVENOUS

## 2012-12-06 MED ORDER — ONDANSETRON 4 MG PO TBDP: 4.0000 mg | ORAL_TABLET | Freq: Three times a day (TID) | ORAL | Status: DC | PRN

## 2012-12-06 NOTE — ED Notes (Signed)
Pt c/o sudden onset of dizziness and nausea with movt x 3 hrs  Hx of same

## 2012-12-06 NOTE — ED Provider Notes (Signed)
History     CSN: [id number]  Arrival date & time 12/06/12  1653   First MD Initiated Contact with Patient 12/06/12 1657      Chief Complaint  Patient presents with  . Dizziness    (Consider location/radiation/quality/duration/timing/severity/associated sxs/prior treatment) HPI Pt states she was in her normal state of health when she had sense of spinning, vomiting and sensation of loss of balance. States she has had similar episodes in the past and took her Antivert but vomited shortly after. Symptoms were worse with movement. They have now resolved and pt states she is feeling much better. She has a history of recurrent inner ear inflammation and is followed by ENT. Has chronic tinnitus. No fever chills. No focal weakness or sensory changes. No visual changes.  Pt seen 1 year ago in ED for same and had negative CT at that time. Pt states she can not get MRI.  Past Medical History  Diagnosis Date  . Hypertension   . Bronchitis   . GERD (gastroesophageal reflux disease)     Past Surgical History  Procedure Date  . Hernia repair   . Carpal tunnel release   . Cataract extraction   . Shoulder surgery     History reviewed. No pertinent family history.  History  Substance Use Topics  . Smoking status: Never Smoker   . Smokeless tobacco: Never Used  . Alcohol Use: Yes     Comment: occasional    OB History    Grav Para Term Preterm Abortions TAB SAB Ect Mult Living                  Review of Systems  Constitutional: Negative for fever and chills.  HENT: Positive for ear pain and tinnitus. Negative for trouble swallowing, neck pain, neck stiffness and voice change.   Eyes: Negative for visual disturbance.  Respiratory: Negative for shortness of breath.   Cardiovascular: Negative for chest pain and palpitations.  Gastrointestinal: Positive for nausea, vomiting and diarrhea. Negative for abdominal pain and constipation.  Musculoskeletal: Negative for back pain.  Skin:  Negative for pallor and rash.  Neurological: Positive for dizziness. Negative for syncope, weakness, light-headedness, numbness and headaches.  All other systems reviewed and are negative.    Allergies  Penicillins cross reactors  Home Medications   Current Outpatient Rx  Name  Route  Sig  Dispense  Refill  . AMLODIPINE BESY-BENAZEPRIL HCL 5-10 MG PO CAPS   Oral   Take 1 capsule by mouth daily.         . ASPIRIN 81 MG PO TABS   Oral   Take 81 mg by mouth daily.         Marland Kitchen BIOTIN PO   Oral   Take 1 tablet by mouth daily.         Marland Kitchen CALCIUM-VITAMIN D-VITAMIN K [id number] MG-UNT-MCG PO CHEW   Oral   Chew 1 each by mouth 2 (two) times daily.         Marland Kitchen CINNAMON PO   Oral   Take 1 capsule by mouth 3 (three) times daily.         Marland Kitchen CIPROFLOXACIN-DEXAMETHASONE 0.3-0.1 % OT SUSP   Both Ears   Place 4 drops into both ears 2 (two) times daily.         . CO Q-10 PO   Oral   Take 1 tablet by mouth daily.         Marland Kitchen RA FLAX SEED OIL PO  Oral   Take 1 capsule by mouth daily.         . ADULT MULTIVITAMIN W/MINERALS CH   Oral   Take 1 tablet by mouth daily.         Marland Kitchen ONDANSETRON 4 MG PO TBDP   Oral   Take 1 tablet (4 mg total) by mouth every 8 (eight) hours as needed for nausea.   20 tablet   0   . PROBIOTIC FORMULA PO   Oral   Take 1 tablet by mouth daily.           BP 152/75  Pulse 81  Temp 98.3 F (36.8 C) (Oral)  Resp 16  Ht 5\' 2"  (1.575 m)  Wt 198 lb (89.812 kg)  BMI 36.21 kg/m2  SpO2 99%  Physical Exam  Nursing note and vitals reviewed. Constitutional: She is oriented to person, place, and time. She appears well-developed and well-nourished. No distress.  HENT:  Head: Normocephalic and atraumatic.  Mouth/Throat: Oropharynx is clear and moist.       Sclerotic appearing bl TM's  Eyes: EOM are normal. Pupils are equal, round, and reactive to light.       Fatigable horizontal nystagmus   Neck: Normal range of motion. Neck supple.        No bruits over carotids.   Cardiovascular: Normal rate and regular rhythm.   Pulmonary/Chest: Effort normal and breath sounds normal. No respiratory distress. She has no wheezes. She has no rales. She exhibits no tenderness.  Abdominal: Soft. Bowel sounds are normal. She exhibits no distension and no mass. There is no tenderness. There is no rebound and no guarding.  Musculoskeletal: Normal range of motion. She exhibits no edema and no tenderness.       No calf tenderness  Neurological: She is alert and oriented to person, place, and time.       5/5 motor in all ext, no drift, CN II-XII intact, sensation intact, finger to nose bl intact  Skin: Skin is warm and dry. No rash noted. No erythema.  Psychiatric: She has a normal mood and affect. Her behavior is normal.    ED Course  Procedures (including critical care time)  Labs Reviewed  CBC WITH DIFFERENTIAL - Abnormal; Notable for the following:    Platelets 140 (*)     Neutrophils Relative 84 (*)     Lymphocytes Relative 10 (*)     All other components within normal limits  COMPREHENSIVE METABOLIC PANEL - Abnormal; Notable for the following:    Glucose, Bld 129 (*)     GFR calc non Af Amer 73 (*)     GFR calc Af Amer 85 (*)     All other components within normal limits  URINALYSIS, ROUTINE W REFLEX MICROSCOPIC - Abnormal; Notable for the following:    APPearance CLOUDY (*)     Ketones, ur 15 (*)     All other components within normal limits   No results found.   1. Peripheral vertigo      Date: 12/06/2012  Rate: 72  Rhythm: normal sinus rhythm  QRS Axis: normal  Intervals: normal  ST/T Wave abnormalities: normal  Conduction Disutrbances:none  Narrative Interpretation:   Old EKG Reviewed: unchanged    MDM  Given history and exam, feel this is peripheral vertigo as prior episodes. Will screen, give fluids. i do not believe imaging is indicated at this point and CT would incompletely eval post fossa.   Pt states that  she  is at her baseline and is requesting D/C. Pt agrees to f/u with ENT. Have given pt strict return precautions.       Loren Racer, MD 12/06/12 1929

## 2013-02-20 ENCOUNTER — Encounter: Payer: Self-pay | Admitting: Internal Medicine

## 2013-02-21 ENCOUNTER — Encounter: Payer: Self-pay | Admitting: Internal Medicine

## 2013-04-01 ENCOUNTER — Telehealth: Payer: Self-pay | Admitting: Cardiovascular Disease

## 2013-04-01 NOTE — Telephone Encounter (Signed)
Pt wants the results of her visit WITH Dr. Florestine Avers note, ekg, labs, etc so she can take these to Dr. Timothy Lasso

## 2013-04-01 NOTE — Telephone Encounter (Signed)
Pt would like the lab results from her last visit with Dr Allyson Sabal faxed to her @ [phone]

## 2013-04-03 ENCOUNTER — Ambulatory Visit: Payer: 59 | Admitting: Pharmacist Clinician (PhC)/ Clinical Pharmacy Specialist

## 2013-06-19 ENCOUNTER — Other Ambulatory Visit: Payer: Self-pay

## 2013-09-19 ENCOUNTER — Other Ambulatory Visit: Payer: Self-pay

## 2013-10-11 ENCOUNTER — Other Ambulatory Visit: Payer: Self-pay | Admitting: Cardiovascular Disease

## 2013-10-14 NOTE — Telephone Encounter (Signed)
Rx was sent to pharmacy electronically. 

## 2015-05-11 ENCOUNTER — Other Ambulatory Visit: Payer: Self-pay

## 2015-06-25 ENCOUNTER — Encounter: Payer: Self-pay | Admitting: Cardiovascular Disease

## 2015-08-21 ENCOUNTER — Encounter (HOSPITAL_COMMUNITY)
Admission: RE | Admit: 2015-08-21 | Discharge: 2015-08-21 | Disposition: A | Discharge status: Home / Self Care | Payer: Medicare Other | Source: Ambulatory Visit | Attending: Orthopaedic Surgery | Admitting: Orthopaedic Surgery

## 2015-08-21 ENCOUNTER — Encounter (HOSPITAL_COMMUNITY): Payer: Self-pay

## 2015-08-21 DIAGNOSIS — M1990 Unspecified osteoarthritis, unspecified site: Secondary | ICD-10-CM | POA: Diagnosis not present

## 2015-08-21 DIAGNOSIS — Z01812 Encounter for preprocedural laboratory examination: Secondary | ICD-10-CM | POA: Diagnosis present

## 2015-08-21 HISTORY — DX: Frequency of micturition: R35.0

## 2015-08-21 HISTORY — DX: Unspecified osteoarthritis, unspecified site: M19.90

## 2015-08-21 HISTORY — DX: Other complications of anesthesia, initial encounter: T88.59XA

## 2015-08-21 HISTORY — DX: Diarrhea, unspecified: R19.7

## 2015-08-21 HISTORY — DX: Adverse effect of unspecified anesthetic, initial encounter: T41.45XA

## 2015-08-21 HISTORY — DX: Family history of other specified conditions: Z84.89

## 2015-08-21 HISTORY — DX: Irritable bowel syndrome, unspecified: K58.9

## 2015-08-21 HISTORY — DX: Type 2 diabetes mellitus without complications: E11.9

## 2015-08-21 LAB — BASIC METABOLIC PANEL
Anion gap: 11 (ref 5–15)
BUN: 19 mg/dL (ref 6–20)
CHLORIDE: 101 mmol/L (ref 101–111)
CO2: 27 mmol/L (ref 22–32)
Calcium: 10.1 mg/dL (ref 8.9–10.3)
Creatinine, Ser: 0.88 mg/dL (ref 0.44–1.00)
GFR calc Af Amer: >60 mL/min (ref >60)
GFR calc non Af Amer: >60 mL/min (ref >60)
GLUCOSE: 150 mg/dL — AB (ref 65–99)
POTASSIUM: 4.3 mmol/L (ref 3.5–5.1)
Sodium: 139 mmol/L (ref 135–145)

## 2015-08-21 LAB — GLUCOSE, CAPILLARY: Glucose-Capillary: 151 mg/dL — ABNORMAL HIGH (ref 65–99)

## 2015-08-21 LAB — SURGICAL PCR SCREEN
MRSA, PCR: NEGATIVE
Staphylococcus aureus: POSITIVE — AB

## 2015-08-21 LAB — CBC
HEMATOCRIT: 39.7 % (ref 36.0–46.0)
Hemoglobin: 13.3 g/dL (ref 12.0–15.0)
MCH: 29.4 pg (ref 26.0–34.0)
MCHC: 33.5 g/dL (ref 30.0–36.0)
MCV: 87.8 fL (ref 78.0–100.0)
Platelets: 167 10*3/uL (ref 150–400)
RBC: 4.52 MIL/uL (ref 3.87–5.11)
RDW: 13.3 % (ref 11.5–15.5)
WBC: 6.9 10*3/uL (ref 4.0–10.5)

## 2015-08-22 LAB — HEMOGLOBIN A1C
Hgb A1c MFr Bld: 8.1 % — ABNORMAL HIGH (ref 4.8–5.6)
Mean Plasma Glucose: 186 mg/dL

## 2015-08-31 MED ORDER — SODIUM CHLORIDE 0.9 % IV SOLN
1000.0000 mg | INTRAVENOUS | Status: DC
  Filled 2015-08-31: qty 10

## 2015-09-01 ENCOUNTER — Inpatient Hospital Stay (HOSPITAL_COMMUNITY)
Admission: RE | Admit: 2015-09-01 | Discharge: 2015-09-04 | DRG: 470 | Disposition: A | Discharge status: Home / Self Care | Payer: Medicare Other | Source: Ambulatory Visit | Attending: Orthopaedic Surgery | Admitting: Orthopaedic Surgery

## 2015-09-01 ENCOUNTER — Inpatient Hospital Stay (HOSPITAL_COMMUNITY): Payer: Medicare Other

## 2015-09-01 ENCOUNTER — Inpatient Hospital Stay (HOSPITAL_COMMUNITY): Payer: Medicare Other | Admitting: Anesthesiology

## 2015-09-01 ENCOUNTER — Encounter (HOSPITAL_COMMUNITY)
Admission: RE | Disposition: A | Discharge status: Home / Self Care | Payer: Self-pay | Source: Ambulatory Visit | Attending: Orthopaedic Surgery

## 2015-09-01 DIAGNOSIS — Z419 Encounter for procedure for purposes other than remedying health state, unspecified: Secondary | ICD-10-CM

## 2015-09-01 DIAGNOSIS — D62 Acute posthemorrhagic anemia: Secondary | ICD-10-CM | POA: Diagnosis not present

## 2015-09-01 DIAGNOSIS — M1612 Unilateral primary osteoarthritis, left hip: Principal | ICD-10-CM

## 2015-09-01 DIAGNOSIS — E119 Type 2 diabetes mellitus without complications: Secondary | ICD-10-CM | POA: Diagnosis present

## 2015-09-01 DIAGNOSIS — K219 Gastro-esophageal reflux disease without esophagitis: Secondary | ICD-10-CM | POA: Diagnosis present

## 2015-09-01 DIAGNOSIS — Z7984 Long term (current) use of oral hypoglycemic drugs: Secondary | ICD-10-CM | POA: Diagnosis not present

## 2015-09-01 DIAGNOSIS — I10 Essential (primary) hypertension: Secondary | ICD-10-CM | POA: Diagnosis present

## 2015-09-01 DIAGNOSIS — Z96642 Presence of left artificial hip joint: Secondary | ICD-10-CM

## 2015-09-01 HISTORY — PX: TOTAL HIP ARTHROPLASTY: SHX124

## 2015-09-01 LAB — GLUCOSE, CAPILLARY
GLUCOSE-CAPILLARY: 168 mg/dL — AB (ref 65–99)
GLUCOSE-CAPILLARY: 134 mg/dL — AB (ref 65–99)
GLUCOSE-CAPILLARY: 172 mg/dL — AB (ref 65–99)
Glucose-Capillary: 171 mg/dL — ABNORMAL HIGH (ref 65–99)

## 2015-09-01 SURGERY — ARTHROPLASTY, HIP, TOTAL, ANTERIOR APPROACH
Anesthesia: Monitor Anesthesia Care | Site: Hip | Laterality: Left

## 2015-09-01 MED ORDER — 0.9 % SODIUM CHLORIDE (POUR BTL) OPTIME
TOPICAL | Status: DC | PRN
  Administered 2015-09-01: 1000 mL

## 2015-09-01 MED ORDER — RESVERATROL 100 MG PO CAPS: 100.0000 mg | ORAL_CAPSULE | Freq: Every day | ORAL | Status: DC

## 2015-09-01 MED ORDER — PROMETHAZINE HCL 25 MG/ML IJ SOLN
INTRAMUSCULAR | Status: AC
  Filled 2015-09-01: qty 1

## 2015-09-01 MED ORDER — PHENYLEPHRINE HCL 10 MG/ML IJ SOLN
INTRAMUSCULAR | Status: DC | PRN
  Administered 2015-09-01: 80 ug via INTRAVENOUS
  Administered 2015-09-01 (×3): 40 ug via INTRAVENOUS

## 2015-09-01 MED ORDER — AMLODIPINE BESY-BENAZEPRIL HCL 5-10 MG PO CAPS: 1.0000 | ORAL_CAPSULE | Freq: Every day | ORAL | Status: DC

## 2015-09-01 MED ORDER — FENTANYL CITRATE (PF) 250 MCG/5ML IJ SOLN
INTRAMUSCULAR | Status: AC
  Filled 2015-09-01: qty 5

## 2015-09-01 MED ORDER — CLINDAMYCIN PHOSPHATE 600 MG/50ML IV SOLN
600.0000 mg | Freq: Four times a day (QID) | INTRAVENOUS | Status: AC
  Administered 2015-09-02: 600 mg via INTRAVENOUS
  Filled 2015-09-01: qty 50

## 2015-09-01 MED ORDER — METOCLOPRAMIDE HCL 5 MG PO TABS: 5.0000 mg | ORAL_TABLET | Freq: Three times a day (TID) | ORAL | Status: DC | PRN

## 2015-09-01 MED ORDER — ACETAMINOPHEN 325 MG PO TABS: 650.0000 mg | ORAL_TABLET | Freq: Four times a day (QID) | ORAL | Status: DC | PRN

## 2015-09-01 MED ORDER — ASPIRIN EC 325 MG PO TBEC
325.0000 mg | DELAYED_RELEASE_TABLET | Freq: Two times a day (BID) | ORAL | Status: DC
  Administered 2015-09-02 – 2015-09-04 (×5): 325 mg via ORAL
  Filled 2015-09-01 (×6): qty 1

## 2015-09-01 MED ORDER — BENAZEPRIL HCL 20 MG PO TABS
10.0000 mg | ORAL_TABLET | Freq: Every day | ORAL | Status: DC
  Administered 2015-09-02 – 2015-09-03 (×2): 10 mg via ORAL
  Filled 2015-09-01 (×3): qty 1

## 2015-09-01 MED ORDER — MENTHOL 3 MG MT LOZG: 1.0000 | LOZENGE | OROMUCOSAL | Status: DC | PRN

## 2015-09-01 MED ORDER — PANTOPRAZOLE SODIUM 40 MG PO TBEC
40.0000 mg | DELAYED_RELEASE_TABLET | Freq: Every day | ORAL | Status: DC
  Administered 2015-09-02 – 2015-09-04 (×3): 40 mg via ORAL
  Filled 2015-09-01 (×3): qty 1

## 2015-09-01 MED ORDER — OXYCODONE HCL 5 MG/5ML PO SOLN: 5.0000 mg | Freq: Once | ORAL | Status: DC | PRN

## 2015-09-01 MED ORDER — ACETAMINOPHEN 650 MG RE SUPP: 650.0000 mg | Freq: Four times a day (QID) | RECTAL | Status: DC | PRN

## 2015-09-01 MED ORDER — LACTATED RINGERS IV SOLN
INTRAVENOUS | Status: DC
  Administered 2015-09-01 (×3): via INTRAVENOUS

## 2015-09-01 MED ORDER — HYDROMORPHONE HCL 1 MG/ML IJ SOLN
INTRAMUSCULAR | Status: AC
  Filled 2015-09-01: qty 1

## 2015-09-01 MED ORDER — PROPOFOL 10 MG/ML IV BOLUS
INTRAVENOUS | Status: AC
  Filled 2015-09-01: qty 20

## 2015-09-01 MED ORDER — METHOCARBAMOL 500 MG PO TABS
500.0000 mg | ORAL_TABLET | Freq: Four times a day (QID) | ORAL | Status: DC | PRN
  Filled 2015-09-01 (×2): qty 1

## 2015-09-01 MED ORDER — ONDANSETRON HCL 4 MG PO TABS: 4.0000 mg | ORAL_TABLET | Freq: Four times a day (QID) | ORAL | Status: DC | PRN

## 2015-09-01 MED ORDER — METHOCARBAMOL 1000 MG/10ML IJ SOLN
500.0000 mg | Freq: Four times a day (QID) | INTRAVENOUS | Status: DC | PRN
  Administered 2015-09-01: 500 mg via INTRAVENOUS
  Filled 2015-09-01 (×2): qty 5

## 2015-09-01 MED ORDER — PROPOFOL 500 MG/50ML IV EMUL
INTRAVENOUS | Status: DC | PRN
  Administered 2015-09-01: 50 ug/kg/min via INTRAVENOUS

## 2015-09-01 MED ORDER — SODIUM CHLORIDE 0.9 % IV SOLN
INTRAVENOUS | Status: DC
  Administered 2015-09-01 – 2015-09-02 (×2): via INTRAVENOUS

## 2015-09-01 MED ORDER — CLINDAMYCIN PHOSPHATE 600 MG/50ML IV SOLN
600.0000 mg | Freq: Four times a day (QID) | INTRAVENOUS | Status: DC
  Administered 2015-09-01: 600 mg via INTRAVENOUS
  Filled 2015-09-01 (×2): qty 50

## 2015-09-01 MED ORDER — CLINDAMYCIN PHOSPHATE 900 MG/50ML IV SOLN
INTRAVENOUS | Status: AC
Start: 2015-09-01 — End: 2015-09-01
  Administered 2015-09-01: 900 mg via INTRAVENOUS
  Filled 2015-09-01: qty 50

## 2015-09-01 MED ORDER — PROMETHAZINE HCL 25 MG/ML IJ SOLN
6.2500 mg | INTRAMUSCULAR | Status: DC | PRN
  Administered 2015-09-01: 6.25 mg via INTRAVENOUS

## 2015-09-01 MED ORDER — OXYCODONE HCL 5 MG PO TABS: 5.0000 mg | ORAL_TABLET | Freq: Once | ORAL | Status: DC | PRN

## 2015-09-01 MED ORDER — ZOLPIDEM TARTRATE 5 MG PO TABS: 5.0000 mg | ORAL_TABLET | Freq: Every evening | ORAL | Status: DC | PRN

## 2015-09-01 MED ORDER — HYDROMORPHONE HCL 1 MG/ML IJ SOLN
1.0000 mg | INTRAMUSCULAR | Status: DC | PRN
  Administered 2015-09-01 – 2015-09-03 (×9): 1 mg via INTRAVENOUS
  Filled 2015-09-01 (×9): qty 1

## 2015-09-01 MED ORDER — DIPHENHYDRAMINE HCL 12.5 MG/5ML PO ELIX: 12.5000 mg | ORAL_SOLUTION | ORAL | Status: DC | PRN

## 2015-09-01 MED ORDER — OXYCODONE HCL 5 MG PO TABS: 5.0000 mg | ORAL_TABLET | ORAL | Status: DC | PRN

## 2015-09-01 MED ORDER — INSULIN ASPART 100 UNIT/ML ~~LOC~~ SOLN
0.0000 [IU] | Freq: Three times a day (TID) | SUBCUTANEOUS | Status: DC
  Administered 2015-09-01 – 2015-09-02 (×2): 3 [IU] via SUBCUTANEOUS
  Administered 2015-09-02 (×2): 5 [IU] via SUBCUTANEOUS
  Administered 2015-09-03: 3 [IU] via SUBCUTANEOUS
  Administered 2015-09-03: 5 [IU] via SUBCUTANEOUS
  Administered 2015-09-03: 2 [IU] via SUBCUTANEOUS
  Administered 2015-09-04: 3 [IU] via SUBCUTANEOUS

## 2015-09-01 MED ORDER — FENTANYL CITRATE (PF) 100 MCG/2ML IJ SOLN
INTRAMUSCULAR | Status: DC | PRN
  Administered 2015-09-01: 25 ug via INTRAVENOUS
  Administered 2015-09-01: 50 ug via INTRAVENOUS
  Administered 2015-09-01 (×3): 25 ug via INTRAVENOUS

## 2015-09-01 MED ORDER — METFORMIN HCL 500 MG PO TABS
500.0000 mg | ORAL_TABLET | Freq: Every day | ORAL | Status: DC
  Administered 2015-09-02: 500 mg via ORAL
  Filled 2015-09-01 (×3): qty 1

## 2015-09-01 MED ORDER — BUPIVACAINE HCL (PF) 0.5 % IJ SOLN
INTRAMUSCULAR | Status: DC | PRN
  Administered 2015-09-01: 3 mL via INTRATHECAL

## 2015-09-01 MED ORDER — PHENYLEPHRINE HCL 10 MG/ML IJ SOLN: 10000.0000 ug | INTRAMUSCULAR | Status: DC | PRN

## 2015-09-01 MED ORDER — ONDANSETRON HCL 4 MG/2ML IJ SOLN
INTRAMUSCULAR | Status: DC | PRN
Start: 2015-09-01 — End: 2015-09-01
  Administered 2015-09-01: 4 mg via INTRAVENOUS

## 2015-09-01 MED ORDER — HYDROMORPHONE HCL 1 MG/ML IJ SOLN
0.2500 mg | INTRAMUSCULAR | Status: DC | PRN
  Administered 2015-09-01 (×2): 0.25 mg via INTRAVENOUS
  Administered 2015-09-01 (×3): 0.5 mg via INTRAVENOUS

## 2015-09-01 MED ORDER — AMLODIPINE BESYLATE 5 MG PO TABS
5.0000 mg | ORAL_TABLET | Freq: Every day | ORAL | Status: DC
  Administered 2015-09-02 – 2015-09-03 (×2): 5 mg via ORAL
  Filled 2015-09-01 (×3): qty 1

## 2015-09-01 MED ORDER — METOCLOPRAMIDE HCL 5 MG/ML IJ SOLN: 5.0000 mg | Freq: Three times a day (TID) | INTRAMUSCULAR | Status: DC | PRN

## 2015-09-01 MED ORDER — PHENOL 1.4 % MT LIQD: 1.0000 | OROMUCOSAL | Status: DC | PRN

## 2015-09-01 MED ORDER — ONDANSETRON HCL 4 MG/2ML IJ SOLN
4.0000 mg | Freq: Four times a day (QID) | INTRAMUSCULAR | Status: DC | PRN
  Administered 2015-09-02: 4 mg via INTRAVENOUS
  Filled 2015-09-01: qty 2

## 2015-09-01 MED ORDER — ALUM & MAG HYDROXIDE-SIMETH 200-200-20 MG/5ML PO SUSP: 30.0000 mL | ORAL | Status: DC | PRN

## 2015-09-01 MED ORDER — CLINDAMYCIN PHOSPHATE 900 MG/50ML IV SOLN: 900.0000 mg | INTRAVENOUS | Status: DC

## 2015-09-01 MED ORDER — DOCUSATE SODIUM 100 MG PO CAPS
100.0000 mg | ORAL_CAPSULE | Freq: Two times a day (BID) | ORAL | Status: DC
  Administered 2015-09-02 – 2015-09-04 (×3): 100 mg via ORAL
  Filled 2015-09-01 (×5): qty 1

## 2015-09-01 SURGICAL SUPPLY — 54 items
APL SKNCLS STERI-STRIP NONHPOA (GAUZE/BANDAGES/DRESSINGS) ×1
BENZOIN TINCTURE PRP APPL 2/3 (GAUZE/BANDAGES/DRESSINGS) ×3 IMPLANT
BLADE SAW SGTL 18X1.27X75 (BLADE) ×2 IMPLANT
BLADE SAW SGTL 18X1.27X75MM (BLADE) ×1
BLADE SURG ROTATE 9660 (MISCELLANEOUS) IMPLANT
CAPT HIP TOTAL 2 ×2 IMPLANT
CELLS DAT CNTRL 66122 CELL SVR (MISCELLANEOUS) ×1 IMPLANT
CLOSURE WOUND 1/2 X4 (GAUZE/BANDAGES/DRESSINGS) ×2
COVER SURGICAL LIGHT HANDLE (MISCELLANEOUS) ×3 IMPLANT
DRAPE C-ARM 42X72 X-RAY (DRAPES) ×3 IMPLANT
DRAPE STERI IOBAN 125X83 (DRAPES) ×3 IMPLANT
DRAPE U-SHAPE 47X51 STRL (DRAPES) ×9 IMPLANT
DRSG AQUACEL AG ADV 3.5X10 (GAUZE/BANDAGES/DRESSINGS) ×3 IMPLANT
DURAPREP 26ML APPLICATOR (WOUND CARE) ×3 IMPLANT
ELECT BLADE 4.0 EZ CLEAN MEGAD (MISCELLANEOUS) ×3
ELECT BLADE 6.5 EXT (BLADE) IMPLANT
ELECT REM PT RETURN 9FT ADLT (ELECTROSURGICAL) ×3
ELECTRODE BLDE 4.0 EZ CLN MEGD (MISCELLANEOUS) ×1 IMPLANT
ELECTRODE REM PT RTRN 9FT ADLT (ELECTROSURGICAL) ×1 IMPLANT
FACESHIELD WRAPAROUND (MASK) ×6 IMPLANT
FACESHIELD WRAPAROUND OR TEAM (MASK) ×2 IMPLANT
GLOVE BIOGEL PI IND STRL 8 (GLOVE) ×2 IMPLANT
GLOVE BIOGEL PI INDICATOR 8 (GLOVE) ×4
GLOVE ECLIPSE 8.0 STRL XLNG CF (GLOVE) ×3 IMPLANT
GLOVE ORTHO TXT STRL SZ7.5 (GLOVE) ×6 IMPLANT
GOWN STRL REUS W/ TWL LRG LVL3 (GOWN DISPOSABLE) ×2 IMPLANT
GOWN STRL REUS W/ TWL XL LVL3 (GOWN DISPOSABLE) ×2 IMPLANT
GOWN STRL REUS W/TWL LRG LVL3 (GOWN DISPOSABLE) ×6
GOWN STRL REUS W/TWL XL LVL3 (GOWN DISPOSABLE) ×6
HANDPIECE INTERPULSE COAX TIP (DISPOSABLE) ×3
KIT BASIN OR (CUSTOM PROCEDURE TRAY) ×3 IMPLANT
KIT ROOM TURNOVER OR (KITS) ×3 IMPLANT
MANIFOLD NEPTUNE II (INSTRUMENTS) ×3 IMPLANT
NS IRRIG 1000ML POUR BTL (IV SOLUTION) ×3 IMPLANT
PACK TOTAL JOINT (CUSTOM PROCEDURE TRAY) ×3 IMPLANT
PACK UNIVERSAL I (CUSTOM PROCEDURE TRAY) ×3 IMPLANT
PAD ARMBOARD 7.5X6 YLW CONV (MISCELLANEOUS) ×3 IMPLANT
RETRACTOR WND ALEXIS 18 MED (MISCELLANEOUS) ×1 IMPLANT
RTRCTR WOUND ALEXIS 18CM MED (MISCELLANEOUS) ×3
SET HNDPC FAN SPRY TIP SCT (DISPOSABLE) ×1 IMPLANT
STAPLER VISISTAT 35W (STAPLE) IMPLANT
STRIP CLOSURE SKIN 1/2X4 (GAUZE/BANDAGES/DRESSINGS) ×4 IMPLANT
SUT ETHIBOND NAB CT1 #1 30IN (SUTURE) ×3 IMPLANT
SUT MNCRL AB 4-0 PS2 18 (SUTURE) ×2 IMPLANT
SUT VIC AB 0 CT1 27 (SUTURE) ×3
SUT VIC AB 0 CT1 27XBRD ANBCTR (SUTURE) ×1 IMPLANT
SUT VIC AB 1 CT1 27 (SUTURE) ×3
SUT VIC AB 1 CT1 27XBRD ANBCTR (SUTURE) ×1 IMPLANT
SUT VIC AB 2-0 CT1 27 (SUTURE) ×3
SUT VIC AB 2-0 CT1 TAPERPNT 27 (SUTURE) ×1 IMPLANT
TOWEL OR 17X24 6PK STRL BLUE (TOWEL DISPOSABLE) ×3 IMPLANT
TOWEL OR 17X26 10 PK STRL BLUE (TOWEL DISPOSABLE) ×3 IMPLANT
TRAY FOLEY CATH 16FRSI W/METER (SET/KITS/TRAYS/PACK) ×2 IMPLANT
WATER STERILE IRR 1000ML POUR (IV SOLUTION) ×6 IMPLANT

## 2015-09-01 NOTE — Progress Notes (Signed)
CRNA at bedside administered Neo for low BP 67/34-88/53. Fluids increased. BP now 92/78. Will continue to monitor.

## 2015-09-01 NOTE — Progress Notes (Signed)
Utilization review completed.  

## 2015-09-01 NOTE — Anesthesia Procedure Notes (Signed)
Spinal Patient location during procedure: OR Staffing Anesthesiologist: Suzette Battiest Performed by: anesthesiologist  Preanesthetic Checklist Completed: patient identified, site marked, surgical consent, pre-op evaluation, timeout performed, IV checked, risks and benefits discussed and monitors and equipment checked Spinal Block Patient position: sitting Prep: DuraPrep Patient monitoring: heart rate, continuous pulse ox and blood pressure Approach: midline Location: L4-5 Injection technique: single-shot Needle Needle type: Spinocan  Needle gauge: 24 G Needle length: 9 cm Additional Notes Expiration date of kit checked and confirmed. Patient tolerated procedure well, without complications.

## 2015-09-01 NOTE — Anesthesia Preprocedure Evaluation (Addendum)
Anesthesia Evaluation  Patient identified by MRN, date of birth, ID band Patient awake    Reviewed: Allergy & Precautions, NPO status , Patient's Chart, lab work & pertinent test results  Airway Mallampati: II  TM Distance: >3 FB Neck ROM: Full    Dental  (+) Upper Dentures, Partial Lower   Pulmonary neg pulmonary ROS,    breath sounds clear to auscultation       Cardiovascular hypertension, Pt. on medications  Rhythm:Regular Rate:Normal     Neuro/Psych negative neurological ROS     GI/Hepatic Neg liver ROS, GERD  ,  Endo/Other  diabetes, Oral Hypoglycemic Agents  Renal/GU negative Renal ROS     Musculoskeletal  (+) Arthritis ,   Abdominal   Peds  Hematology negative hematology ROS (+)   Anesthesia Other Findings   Reproductive/Obstetrics                            Lab Results  Component Value Date   WBC 6.9 08/21/2015   HGB 13.3 08/21/2015   HCT 39.7 08/21/2015   MCV 87.8 08/21/2015   PLT 167 08/21/2015    Lab Results  Component Value Date   CREATININE 0.88 08/21/2015   BUN 19 08/21/2015   NA 139 08/21/2015   K 4.3 08/21/2015   CL 101 08/21/2015   CO2 27 08/21/2015    Anesthesia Physical Anesthesia Plan  ASA: II  Anesthesia Plan: Spinal and MAC   Post-op Pain Management:    Induction: Intravenous  Airway Management Planned: Natural Airway and Simple Face Mask  Additional Equipment:   Intra-op Plan:   Post-operative Plan:   Informed Consent: I have reviewed the patients History and Physical, chart, labs and discussed the procedure including the risks, benefits and alternatives for the proposed anesthesia with the patient or authorized representative who has indicated his/her understanding and acceptance.     Plan Discussed with: CRNA  Anesthesia Plan Comments:        Anesthesia Quick Evaluation

## 2015-09-01 NOTE — Transfer of Care (Signed)
Immediate Anesthesia Transfer of Care Note  Patient: Robin Vaughn Dattilo  Procedure(s) Performed: Procedure(s) with comments: LEFT TOTAL HIP ARTHROPLASTY ANTERIOR APPROACH (Left) - NEEDS RNFA  Patient Location: PACU  Anesthesia Type:MAC and Spinal  Level of Consciousness: awake, alert  and oriented  Airway & Oxygen Therapy: Patient Spontanous Breathing and Patient connected to nasal cannula oxygen  Post-op Assessment: Report given to RN and Post -op Vital signs reviewed and stable  Post vital signs: Reviewed and stable  Last Vitals:  Filed Vitals:   09/01/15 1430  BP: 113/85  Pulse: 80  Temp:   Resp: 22    Complications: No apparent anesthesia complications

## 2015-09-01 NOTE — Op Note (Signed)
Robin Vaughn, Robin Vaughn NO.:  [id number]  MEDICAL RECORD NO.:  [id number]  LOCATION:  5N24C                        FACILITY:  [facility]  PHYSICIAN:  Vanita Panda. Magnus Ivan, M.D.DATE OF BIRTH:  [birth year]  DATE OF PROCEDURE:  09/01/2015 DATE OF DISCHARGE:                              OPERATIVE REPORT   PREOPERATIVE DIAGNOSIS:  Primary osteoarthritis and degenerative joint disease, left hip.  POSTOPERATIVE DIAGNOSIS:  Primary osteoarthritis and degenerative joint disease, left hip.  PROCEDURE:  Left total hip arthroplasty through direct anterior approach.  IMPLANTS:  DePuy Sector Gription acetabular component size 50, size 32+ 0 neutral polyethylene liner, size 13 Corail femoral component with standard offset, size 32+ 5 ceramic hip ball.  SURGEON:  Doneen Poisson, MD.  ASSISTANT:  April Chilton Si, RNFA.  ANESTHESIA:  Spinal.  ANTIBIOTICS:  IV clindamycin 900 mg.  ESTIMATED BLOOD LOSS:  Less than 500 mL.  COMPLICATIONS:  None.  INDICATIONS:  Robin Vaughn is a very pleasant 74 year old female with debilitating arthritis involving her left hip.  She has tried and failed all forms of conservative treatment.  It has gotten rapidly worse over the last year to where it affects her activities of daily living, her mobility, and her quality of life.  At this point, with failure of all modes of conservative treatment, a total hip arthroplasty has been recommended through the direct anterior approach.  I explained to her the risks of acute blood anemia, nerve and vessel injury, fracture infection, dislocation, and DVT.  She understands the goals are decreased pain, improved mobility, and overall improved quality of life.  PROCEDURE IN DETAIL:  After informed consent was obtained, appropriate left hip was marked.  She was brought to the operating room where a spinal anesthesia was obtained while she was on her stretcher.  We then had her lay in the supine position  on the stretcher.  A Foley catheter was placed and then both feet had traction boots applied to them.  Next, she was placed supine on the Hana fracture table with the perineal post in place and both legs in inline skeletal traction devices, but no traction applied.  Her left operative hip was then prepped and draped with DuraPrep and sterile drapes.  A time-out was called to identify correct patient, correct left hip.  I then made an incision inferior and posterior to the anterior superior iliac spine and carried this obliquely down the leg.  I dissected down to the tensor fascia lata muscle and tensor fascia.  We then divided it longitudinally, so I could proceed with a direct anterior approach to the hip.  I identified and cauterized the lateral femoral circumflex vessels and then identified the hip capsule.  We cauterized the lateral femoral circumflex vessels and then placed Cobra retractors on the medial femoral neck up underneath the rectus femoris into the lateral femoral neck.  I then opened up the hip capsule in an L-type format finding a large joint effusion and placing Cobra retractors within the joint capsule.  I then made my femoral neck cut with an oscillating saw just proximal to the lesser trochanter and completed this on osteotome.  We placed a corkscrew guide in the  femoral head and removed the femoral head in its entirety and found to be completely hard, denuded, and devoid of cartilage completely.  I then cleaned the acetabulum with an acetabular labrum, and placed a bent Hohmann underneath the medial acetabular rim. I released the transverse acetabular ligament and then began reaming under direct visualization from a size 42 reamer up to a size 50 with all reamers under direct visualization and the last reamer direct fluoroscopy, so I could obtain my depth of reaming, my inclination, and anteversion.  I then placed the real DePuy Sector Gription acetabular component  size 50 and a 32+ 0 polyethylene liner for that size of acetabular component.  Attention was then turned to the femur.  With the leg externally rotated to 100 degrees, extended and adducted, we were able to place the Mueller retractor medially and a Hohmann retractor behind the greater trochanter.  I released the lateral joint capsule and used a box cutting osteotome to enter the femoral canal and a rongeur to lateralize.  We then began broaching from a size 8, broached up to a size 13.  With a 13 in place,we trialed the standard neck and a 32+ 1 hip ball and brought the leg back over and up with traction and internal rotation reducing the pelvis, and I felt that she was just a little bit too loose.  We then dislocated the hip and placed the real size 13 stem and the real size +5 ceramic hip ball.  We reduced this back in the acetabulum.  I was pleased with stability, range of motion, offset, and leg lengths.  We then irrigated the soft tissues with normal saline solution using pulsatile lavage.  I was able to close the joint capsule with interrupted #1 Ethibond suture, followed by running #1 Vicryl in the tensor fascia, 0 Vicryl in the deep tissue, 2-0 Vicryl in the subcutaneous tissue, and staples on the skin.  Xeroform and Aquacel dressing was applied.  She was then taken off the Hana table and taken to the recovery room in stable condition.  All final counts were correct.  There were no complications noted.     Vanita Panda. Magnus Ivan, M.D.     CYB/MEDQ  D:  09/01/2015  T:  09/01/2015  Job:  [id number]

## 2015-09-01 NOTE — Progress Notes (Signed)
Orthopedic Tech Progress Note Patient Details:  Robin Vaughn 31-Mar-1941 [id number]  Ortho Devices Ortho Device/Splint Location: applied ohf to bed Ortho Device/Splint Interventions: Ordered, Application   Jennye Moccasin 09/01/2015, 5:51 PM

## 2015-09-01 NOTE — H&P (Signed)
TOTAL HIP ADMISSION H&P  Patient is admitted for left total hip arthroplasty.  Subjective:  Chief Complaint: left hip pain  HPI: Robin Vaughn, 74 y.o. female, has a history of pain and functional disability in the left hip(s) due to arthritis and patient has failed non-surgical conservative treatments for greater than 12 weeks to include NSAID's and/or analgesics, corticosteriod injections, flexibility and strengthening excercises, use of assistive devices, weight reduction as appropriate and activity modification.  Onset of symptoms was gradual starting 2 years ago with gradually worsening course since that time.  Patient currently rates pain in the left hip at 10 out of 10 with activity. Patient has night pain, worsening of pain with activity and weight bearing, pain that interfers with activities of daily living and pain with passive range of motion. Patient has evidence of subchondral cysts, subchondral sclerosis, periarticular osteophytes and joint space narrowing by imaging studies. This condition presents safety issues increasing the risk of falls.  There is no current active infection.  Patient Active Problem List   Diagnosis Date Noted  . Osteoarthritis of left hip 09/01/2015   Past Medical History  Diagnosis Date  . Hypertension   . Bronchitis   . GERD (gastroesophageal reflux disease)   . Exertional dyspnea   . Atypical chest pain     Nuc/GXT 05/02/2006--Post Ejection Fraction 83%---rare PVC's with stress  . History of nephritis   . Pneumonia   . Complication of anesthesia     Pt BP dropped during hernia surgery at age 42. Pt stated "I almost died"  . Family history of adverse reaction to anesthesia     Pts cousin was given anesthesia and he could not speak after anesthesia, although he was conscious  . Diabetes mellitus without complication (HCC)     Type 2  . Frequency of urination   . IBS (irritable bowel syndrome)     Hx of  . Diarrhea     D/t metformin  .  Arthritis     Past Surgical History  Procedure Laterality Date  . Hernia repair    . Carpal tunnel release Bilateral   . Cataract extraction Bilateral   . Shoulder surgery Left   . Colonoscopy w/ polypectomy    . Nasal sinus surgery      Prescriptions prior to admission  Medication Sig Dispense Refill Last Dose  . alendronate (FOSAMAX) 70 MG tablet Take 70 mg by mouth once a week. On Monday.   Past Week at Unknown time  . amLODipine-benazepril (LOTREL) 5-10 MG per capsule TAKE ONE CAPSULE BY MOUTH ONCE DAILY 30 capsule 4 08/31/2015 at Unknown time  . Cholecalciferol (VITAMIN D-3 PO) Take 1 capsule by mouth 2 (two) times a week.   Past Week at Unknown time  . Coenzyme Q10 (CO Q-10 PO) Take 200 mg by mouth 3 (three) times a week.    Past Week at Unknown time  . CRANBERRY PO Take 1 capsule by mouth 3 (three) times a week.   Past Week at Unknown time  . Glucosamine-Chondroitin (GLUCOSAMINE CHONDR COMPLEX PO) Take 1 tablet by mouth daily.   Past Week at Unknown time  . metFORMIN (GLUCOPHAGE) 500 MG tablet Take 500 mg by mouth daily.   08/31/2015 at Unknown time  . Misc Natural Products (LEG VEIN & CIRCULATION PO) Take 1 tablet by mouth daily.     . Multiple Vitamin (ESSENTIAL ONE DAILY PO) Take 1 tablet by mouth 3 (three) times a week.   Past Week at  Unknown time  . OVER THE COUNTER MEDICATION Take 2 capsules by mouth daily. "Basis Nicotinamide Riboside"     . PHOSPHATIDYL CHOLINE PO Take 1 capsule by mouth 2 (two) times a week.     . Probiotic Product (PROBIOTIC PO) Take 1 capsule by mouth daily.   Past Week at Unknown time  . Resveratrol 100 MG CAPS Take 100 mg by mouth daily.   Past Week at Unknown time  . TURMERIC PO Take 1 capsule by mouth daily.   Past Week at Unknown time  . ondansetron (ZOFRAN ODT) 4 MG disintegrating tablet Take 1 tablet (4 mg total) by mouth every 8 (eight) hours as needed for nausea. (Patient not taking: Reported on 08/20/2015) 20 tablet 0    Allergies  Allergen  Reactions  . Penicillins Cross Reactors Other (See Comments)    Hallucinations and erythema    Social History  Substance Use Topics  . Smoking status: Never Smoker   . Smokeless tobacco: Never Used  . Alcohol Use: No    Family History  Problem Relation Age of Onset  . Diabetes Mother     Borderline in 6's  . Lung cancer Father      Review of Systems  Musculoskeletal: Positive for joint pain.  All other systems reviewed and are negative.   Objective:  Physical Exam  Constitutional: She is oriented to person, place, and time. She appears well-developed and well-nourished.  HENT:  Head: Normocephalic and atraumatic.  Eyes: EOM are normal. Pupils are equal, round, and reactive to light.  Neck: Normal range of motion. Neck supple.  Cardiovascular: Normal rate and regular rhythm.   Respiratory: Effort normal and breath sounds normal.  GI: Soft. Bowel sounds are normal.  Musculoskeletal:       Left hip: She exhibits decreased range of motion, decreased strength, tenderness and bony tenderness.  Neurological: She is alert and oriented to person, place, and time.  Skin: Skin is warm and dry.  Psychiatric: She has a normal mood and affect.    Vital signs in last 24 hours: Temp:  [98.2 F (36.8 C)] 98.2 F (36.8 C) (10/18 1118) Pulse Rate:  [88] 88 (10/18 1118) BP: (120)/(70) 120/70 mmHg (10/18 1118) SpO2:  [98 %] 98 % (10/18 1118) Weight:  [92.987 kg (205 lb)] 92.987 kg (205 lb) (10/18 1118)  Labs:   Estimated body mass index is 38.11 kg/(m^2) as calculated from the following:   Height as of this encounter: 5' 1.5" (1.562 m).   Weight as of this encounter: 92.987 kg (205 lb).   Imaging Review Plain radiographs demonstrate severe degenerative joint disease of the left hip(s). The bone quality appears to be good for age and reported activity level.  Assessment/Plan:  End stage arthritis, left hip(s)  The patient history, physical examination, clinical judgement of  the provider and imaging studies are consistent with end stage degenerative joint disease of the left hip(s) and total hip arthroplasty is deemed medically necessary. The treatment options including medical management, injection therapy, arthroscopy and arthroplasty were discussed at length. The risks and benefits of total hip arthroplasty were presented and reviewed. The risks due to aseptic loosening, infection, stiffness, dislocation/subluxation,  thromboembolic complications and other imponderables were discussed.  The patient acknowledged the explanation, agreed to proceed with the plan and consent was signed. Patient is being admitted for inpatient treatment for surgery, pain control, PT, OT, prophylactic antibiotics, VTE prophylaxis, progressive ambulation and ADL's and discharge planning.The patient is planning to be discharged  home with home health services

## 2015-09-01 NOTE — Brief Op Note (Signed)
09/01/2015  2:17 PM  PATIENT:  Robin Vaughn  74 y.o. female  PRE-OPERATIVE DIAGNOSIS:  severe endstage arthritis left hip  POST-OPERATIVE DIAGNOSIS:  severe endstage arthritis left hip  PROCEDURE:  Procedure(s) with comments: LEFT TOTAL HIP ARTHROPLASTY ANTERIOR APPROACH (Left) - NEEDS RNFA  SURGEON:  Surgeon(s) and Role:    * [name], MD - Primary    ASSISTANTS: April Green, RNFA   ANESTHESIA:   spinal  EBL:  Total I/O In: 1000 [I.V.:1000] Out: 475 [Urine:75; Blood:400]  BLOOD ADMINISTERED:none  DRAINS: none   LOCAL MEDICATIONS USED:  NONE  SPECIMEN:  No Specimen  DISPOSITION OF SPECIMEN:  N/A  COUNTS:  YES  TOURNIQUET:  * No tourniquets in log *  DICTATION: .Other Dictation: Dictation Number [id number]  PLAN OF CARE: Admit to inpatient   PATIENT DISPOSITION:  PACU - hemodynamically stable.   Delay start of Pharmacological VTE agent (>24hrs) due to surgical blood loss or risk of bleeding: no

## 2015-09-02 ENCOUNTER — Encounter (HOSPITAL_COMMUNITY): Payer: Self-pay | Admitting: Orthopaedic Surgery

## 2015-09-02 LAB — BASIC METABOLIC PANEL
ANION GAP: 9 (ref 5–15)
BUN: 16 mg/dL (ref 6–20)
CALCIUM: 7.9 mg/dL — AB (ref 8.9–10.3)
CO2: 24 mmol/L (ref 22–32)
Chloride: 101 mmol/L (ref 101–111)
Creatinine, Ser: 0.81 mg/dL (ref 0.44–1.00)
GFR calc Af Amer: >60 mL/min (ref >60)
GLUCOSE: 224 mg/dL — AB (ref 65–99)
Potassium: 4.2 mmol/L (ref 3.5–5.1)
Sodium: 134 mmol/L — ABNORMAL LOW (ref 135–145)

## 2015-09-02 LAB — CBC
HEMATOCRIT: 32.2 % — AB (ref 36.0–46.0)
Hemoglobin: 10.4 g/dL — ABNORMAL LOW (ref 12.0–15.0)
MCH: 28.8 pg (ref 26.0–34.0)
MCHC: 32.3 g/dL (ref 30.0–36.0)
MCV: 89.2 fL (ref 78.0–100.0)
PLATELETS: 156 10*3/uL (ref 150–400)
RBC: 3.61 MIL/uL — ABNORMAL LOW (ref 3.87–5.11)
RDW: 13.5 % (ref 11.5–15.5)
WBC: 7.5 10*3/uL (ref 4.0–10.5)

## 2015-09-02 LAB — GLUCOSE, CAPILLARY
Glucose-Capillary: 186 mg/dL — ABNORMAL HIGH (ref 65–99)
Glucose-Capillary: 208 mg/dL — ABNORMAL HIGH (ref 65–99)
Glucose-Capillary: 211 mg/dL — ABNORMAL HIGH (ref 65–99)
Glucose-Capillary: 164 mg/dL — ABNORMAL HIGH (ref 65–99)

## 2015-09-02 NOTE — Evaluation (Signed)
Physical Therapy Evaluation Patient Details Name: Robin Vaughn MRN: [id number] DOB: [birth year] Today's Date: 09/02/2015   History of Present Illness  Lt direct anterior THA, PMH: diabetes, hypertension  Clinical Impression  Pt is s/p direct anterior THA resulting in the deficits listed below (see PT Problem List). Pt will benefit from skilled PT to increase their independence and safety with mobility to allow discharge to the venue listed below. Patient states that she is not sure of where she will go upon D/C but may stay with daughter. Initial mobility was slow and requiring extensive assistance. Multiple cues for safety and mobility techniques needed throughout. Will continue to work with patient to increase function mobility and independence.       Follow Up Recommendations Home health PT;Supervision for mobility/OOB    Equipment Recommendations  None recommended by PT;Other (comment) (patient reports having rw at home. )    Recommendations for Other Services       Precautions / Restrictions Precautions Precautions: Fall Restrictions Weight Bearing Restrictions: Yes LLE Weight Bearing: Weight bearing as tolerated      Mobility  Bed Mobility Overal bed mobility: Needs Assistance Bed Mobility: Supine to Sit     Supine to sit: Max assist;HOB elevated (HOB 20 degrees)     General bed mobility comments: repeated cues during session for technique, patient not following instructions. Patient attempting to pull on therapist. Slow sequence to get to sitting.   Transfers Overall transfer level: Needs assistance Equipment used: Rolling walker (2 wheeled) Transfers: Sit to/from Stand Sit to Stand: Mod assist         General transfer comment: patient not following cues for hand placement with rw, pulling on walker and leaning on elbows.   Ambulation/Gait Ambulation/Gait assistance: Mod assist Ambulation Distance (Feet): 3 Feet Assistive device: Rolling walker (2  wheeled) Gait Pattern/deviations: Decreased step length - right;Decreased step length - left;Step-to pattern;Decreased weight shift to left Gait velocity: decreased   General Gait Details: initially requiring physical assistance to advance LLE, able to progress to independent. Tendency toward flexed trunk. Patient leaning forward and using cross bar for support, repeated cues needed for safe use of device.   Stairs            Wheelchair Mobility    Modified Rankin (Stroke Patients Only)       Balance Overall balance assessment: Needs assistance Sitting-balance support: Single extremity supported Sitting balance-Leahy Scale: Poor     Standing balance support: Bilateral upper extremity supported Standing balance-Leahy Scale: Poor Standing balance comment: using rw                             Pertinent Vitals/Pain Pain Assessment: 0-10 Pain Score: 5  Pain Location: Lt hip Pain Descriptors / Indicators: Throbbing Pain Intervention(s): Limited activity within patient's tolerance;Monitored during session;Premedicated before session    Home Living Family/patient expects to be discharged to:: Unsure Living Arrangements: Other (Comment) (lives alone but may stay with daughter)                    Prior Function Level of Independence: Independent               Hand Dominance        Extremity/Trunk Assessment   Upper Extremity Assessment: Defer to OT evaluation           Lower Extremity Assessment: LLE deficits/detail   LLE Deficits / Details: requiring assistance to move  with bed mobility     Communication   Communication: No difficulties  Cognition Arousal/Alertness: Awake/alert Behavior During Therapy: WFL for tasks assessed/performed Overall Cognitive Status: Within Functional Limits for tasks assessed                      General Comments      Exercises        Assessment/Plan    PT Assessment Patient needs  continued PT services  PT Diagnosis Difficulty walking;Abnormality of gait;Generalized weakness   PT Problem List Decreased strength;Decreased range of motion;Decreased activity tolerance;Decreased balance;Decreased mobility;Decreased knowledge of use of DME;Pain  PT Treatment Interventions DME instruction;Gait training;Stair training;Functional mobility training;Therapeutic activities;Therapeutic exercise;Balance training;Patient/family education   PT Goals (Current goals can be found in the Care Plan section) Acute Rehab PT Goals Patient Stated Goal: go home with daughter PT Goal Formulation: With patient Time For Goal Achievement: 09/16/15 Potential to Achieve Goals: Good    Frequency 7X/week   Barriers to discharge Decreased caregiver support      Co-evaluation               End of Session Equipment Utilized During Treatment: Gait belt Activity Tolerance: Patient limited by fatigue;No increased pain Patient left: in chair;with call bell/phone within reach;with nursing/sitter in room Nurse Communication: Mobility status;Precautions;Weight bearing status         Time: 1610-9604 PT Time Calculation (min) (ACUTE ONLY): 30 min   Charges:   PT Evaluation $Initial PT Evaluation Tier I: 1 Procedure PT Treatments $Therapeutic Activity: 8-22 mins   PT G Codes:        Christiane Ha, PT, CSCS Pager [phone] Office [phone]  09/02/2015, 12:33 PM

## 2015-09-02 NOTE — Progress Notes (Signed)
Subjective: 1 Day Post-Op Procedure(s) (LRB): LEFT TOTAL HIP ARTHROPLASTY ANTERIOR APPROACH (Left) Patient reports pain as moderate.  Asymptomatic acute blood loss anemia.  Objective: Vital signs in last 24 hours: Temp:  [97 F (36.1 C)-99.3 F (37.4 C)] 99.3 F (37.4 C) (10/19 0542) Pulse Rate:  [65-123] 93 (10/19 0542) Resp:  [8-22] 18 (10/19 0542) BP: (88-135)/(51-89) 135/67 mmHg (10/19 0542) SpO2:  [96 %-100 %] 99 % (10/19 0542) Weight:  [92.987 kg (205 lb)] 92.987 kg (205 lb) (10/18 1118)  Intake/Output from previous day: 10/18 0701 - 10/19 0700 In: 1200 [I.V.:1200] Out: 1225 [Urine:825; Blood:400] Intake/Output this shift: Total I/O In: -  Out: 400 [Urine:400]   Recent Labs  09/02/15 0405  HGB 10.4*    Recent Labs  09/02/15 0405  WBC 7.5  RBC 3.61*  HCT 32.2*  PLT 156    Recent Labs  09/02/15 0405  NA 134*  K 4.2  CL 101  CO2 24  BUN 16  CREATININE 0.81  GLUCOSE 224*  CALCIUM 7.9*   No results for input(s): LABPT, INR in the last 72 hours.  Sensation intact distally Intact pulses distally Dorsiflexion/Plantar flexion intact Incision: scant drainage  Assessment/Plan: 1 Day Post-Op Procedure(s) (LRB): LEFT TOTAL HIP ARTHROPLASTY ANTERIOR APPROACH (Left) Up with therapy  Alexus Michael Y 09/02/2015, 6:58 AM

## 2015-09-02 NOTE — Clinical Documentation Improvement (Signed)
Orthopedic Surgery  (please document your response in the progress notes and discharge summary, not on the query form itself.)  Possible Clinical Conditions:  - Postprocedural Hypotension  - Other condition  - Unable to clinically determine  Clinical Information: "CRNA at bedside administered Neo for low BP 67/34-88/53. Fluids increased. BP now 92/78. Will continue to monitor." is documented by [facility] RN 09/01/15 at 2:25 pm.    Please exercise your independent, professional judgment when responding. A specific answer is not anticipated or expected.   Thank You, Jerral Ralph   RN BSN CCDS [phone] Health Information Management [place]

## 2015-09-02 NOTE — Care Management Note (Signed)
Case Management Note  Patient Details  Name: Robin Vaughn MRN: [id number] Date of Birth: 04-Jul-1941  Subjective/Objective:      S/p left total hip arthroplasty               Action/Plan: Set up with [name] for HHPT by MD office. OT recommending HHOT. Contacted [name] with [name] and set up [facility] in addition to HHPT. Spoke with patient, no change in discharge plan. Patient stated that she will be staying with her daughter who will be able to assist her. [employer] aware of daughter's address.Patient stated that she has  A rolling walker and a 3N1.   Expected Discharge Date:                  Expected Discharge Plan:  Home w Home Health Services  In-House Referral:  NA  Discharge planning Services  CM Consult  Post Acute Care Choice:  Home Health Choice offered to:  Patient  DME Arranged:    DME Agency:     HH Arranged:  PT, OT HH Agency:  [facility]  Status of Service:  Completed, signed off  Medicare Important Message Given:    Date Medicare IM Given:    Medicare IM give by:    Date Additional Medicare IM Given:    Additional Medicare Important Message give by:     If discussed at Long Length of Stay Meetings, dates discussed:    Additional Comments:  [name], RN 09/02/2015, 3:35 PM

## 2015-09-02 NOTE — Evaluation (Signed)
Occupational Therapy Evaluation Patient Details Name: NATHASHA MCGILLIVRAY MRN: [id number] DOB: [birth year] Today's Date: 09/02/2015    History of Present Illness Lt direct anterior THA, PMH: diabetes, hypertension   Clinical Impression   Pt reports she was independent with ADLs and mobility PTA. Educated pt on compensatory strategies, use of AE for increased independence with ADLs, edema management techniques, safety with RW; pt verbalized understanding. Pt plan to d/c home with 24/7 supervision from her daughter for a few days. Due to current functional level and disposition to return to living alone after a few days, recommending HHOT in order to maximize pts independence and safety with ADLs and functional mobility. Pt would benefit from continued skilled OT in order to increase independence and safety with LB ADLs using AE and functional transfers prior to returning home.     Follow Up Recommendations  Home health OT;Supervision - Intermittent    Equipment Recommendations  Other (comment) (AE: lh sponge, lh shoe horn, reacher, sock aide)    Recommendations for Other Services       Precautions / Restrictions Precautions Precautions: Fall Restrictions Weight Bearing Restrictions: Yes LLE Weight Bearing: Weight bearing as tolerated      Mobility Bed Mobility Overal bed mobility: Needs Assistance Bed Mobility: Supine to Sit     Supine to sit: Max assist;HOB elevated (HOB 20 degrees)     General bed mobility comments: Pt in recliner, returned to recliner at end of session.  Transfers Overall transfer level: Needs assistance Equipment used: Rolling walker (2 wheeled) Transfers: Sit to/from Stand Sit to Stand: Mod assist         General transfer comment: Mod A to boost up, increased time required. Verbal cues for hand placement and technique, pt demonstrated understanding.    Balance Overall balance assessment: Needs assistance Sitting-balance support: Single extremity  supported Sitting balance-Leahy Scale: Poor     Standing balance support: Bilateral upper extremity supported Standing balance-Leahy Scale: Poor Standing balance comment: RW for support                            ADL Overall ADL's : Needs assistance/impaired Eating/Feeding: Set up;Sitting   Grooming: Set up;Sitting       Lower Body Bathing: Moderate assistance;Sit to/from stand       Lower Body Dressing: Moderate assistance;Sit to/from stand   Toilet Transfer: Moderate assistance;Stand-pivot;BSC;RW           Functional mobility during ADLs: Minimal assistance;Rolling walker General ADL Comments: No family present during OT eval. Pt seems to be a poor historian, difficult to determine what equipment she has available at home. Educated pt on compensatory strategies for LB ADLs, use of AE for independence with LB ADLs, edema management techniques, safety with RW; pt verbalized understanding.      Vision     Perception     Praxis      Pertinent Vitals/Pain Pain Assessment: Faces Pain Score: 5  Faces Pain Scale: Hurts even more Pain Location: L hip Pain Descriptors / Indicators: Grimacing;Guarding;Sore Pain Intervention(s): Limited activity within patient's tolerance;Monitored during session;Repositioned;Ice applied     Hand Dominance     Extremity/Trunk Assessment Upper Extremity Assessment Upper Extremity Assessment: Generalized weakness   Lower Extremity Assessment Lower Extremity Assessment: Defer to PT evaluation LLE Deficits / Details: requiring assistance to move with bed mobility       Communication Communication Communication: No difficulties   Cognition Arousal/Alertness: Awake/alert Behavior During Therapy: [facility]  for tasks assessed/performed Overall Cognitive Status: Within Functional Limits for tasks assessed                     General Comments       Exercises       Shoulder Instructions      Home Living  Family/patient expects to be discharged to:: Private residence Living Arrangements: Alone (planning to stay with daughter for a few days) Available Help at Discharge: Family;Available 24 hours/day (for a few days) Type of Home: House       Home Layout: Two level;Able to live on main level with bedroom/bathroom     Bathroom Shower/Tub: Producer, television/film/video: Standard Bathroom Accessibility: Yes How Accessible: Accessible via walker Home Equipment: Walker - 2 wheels;Bedside commode (Borrowed from ex-husband)   Additional Comments: Pt seems to be a poor historian, unclear what equipment she has available      Prior Functioning/Environment Level of Independence: Independent             OT Diagnosis: Generalized weakness;Acute pain   OT Problem List: Decreased strength;Decreased activity tolerance;Impaired balance (sitting and/or standing);Decreased safety awareness;Decreased knowledge of use of DME or AE;Decreased knowledge of precautions;Pain   OT Treatment/Interventions: Self-care/ADL training;DME and/or AE instruction;Patient/family education    OT Goals(Current goals can be found in the care plan section) Acute Rehab OT Goals Patient Stated Goal: to get back to work OT Goal Formulation: With patient Time For Goal Achievement: 09/16/15 Potential to Achieve Goals: Good ADL Goals Pt Will Perform Grooming: with min guard assist;standing Pt Will Perform Lower Body Bathing: with min guard assist;with adaptive equipment;sit to/from stand Pt Will Perform Lower Body Dressing: with min guard assist;with adaptive equipment;sit to/from stand Pt Will Transfer to Toilet: with min guard assist;ambulating;bedside commode (BSC over toilet) Pt Will Perform Tub/Shower Transfer: Shower transfer;with min guard assist;ambulating;3 in 1;rolling walker  OT Frequency: Min 2X/week   Barriers to D/C:            Co-evaluation              End of Session Equipment Utilized  During Treatment: Gait belt;Rolling walker Nurse Communication: Mobility status  Activity Tolerance: Patient tolerated treatment well Patient left: in chair;with call bell/phone within reach   Time: 1430-1451 OT Time Calculation (min): 21 min Charges:  OT General Charges $OT Visit: 1 Procedure OT Evaluation $Initial OT Evaluation Tier I: 1 Procedure G-Codes:     Gaye Alken M.S., OTR/L Pager: [phone]  09/02/2015, 3:07 PM

## 2015-09-02 NOTE — Anesthesia Postprocedure Evaluation (Signed)
  Anesthesia Post-op Note  Patient: Robin Vaughn  Procedure(s) Performed: Procedure(s) with comments: LEFT TOTAL HIP ARTHROPLASTY ANTERIOR APPROACH (Left) - NEEDS RNFA  Patient Location: PACU  Anesthesia Type:MAC and Spinal  Level of Consciousness: awake and alert   Airway and Oxygen Therapy: Patient Spontanous Breathing  Post-op Pain: mild  Post-op Assessment: Post-op Vital signs reviewed LLE Motor Response:  (wiggles toets) LLE Sensation:  (decreased due to spinal) RLE Motor Response:  (wiggles toes, bends knee up off bed) RLE Sensation:  (decreased due to spinal) L Sensory Level: S1-Sole of foot, small toes R Sensory Level: S1-Sole of foot, small toes  Post-op Vital Signs: Reviewed  Last Vitals:  Filed Vitals:   09/02/15 0542  BP: 135/67  Pulse: 93  Temp: 37.4 C  Resp: 18    Complications: No apparent anesthesia complications

## 2015-09-03 LAB — CBC
HCT: 30.5 % — ABNORMAL LOW (ref 36.0–46.0)
HEMOGLOBIN: 9.8 g/dL — AB (ref 12.0–15.0)
MCH: 28.9 pg (ref 26.0–34.0)
MCHC: 32.1 g/dL (ref 30.0–36.0)
MCV: 90 fL (ref 78.0–100.0)
PLATELETS: 125 10*3/uL — AB (ref 150–400)
RBC: 3.39 MIL/uL — AB (ref 3.87–5.11)
RDW: 13.8 % (ref 11.5–15.5)
WBC: 8.7 10*3/uL (ref 4.0–10.5)

## 2015-09-03 LAB — GLUCOSE, CAPILLARY
GLUCOSE-CAPILLARY: 192 mg/dL — AB (ref 65–99)
GLUCOSE-CAPILLARY: 147 mg/dL — AB (ref 65–99)
GLUCOSE-CAPILLARY: 187 mg/dL — AB (ref 65–99)
Glucose-Capillary: 208 mg/dL — ABNORMAL HIGH (ref 65–99)

## 2015-09-03 MED ORDER — METHOCARBAMOL 500 MG PO TABS: 500.0000 mg | ORAL_TABLET | Freq: Four times a day (QID) | ORAL | Status: DC | PRN

## 2015-09-03 MED ORDER — ASPIRIN 325 MG PO TBEC: 325.0000 mg | DELAYED_RELEASE_TABLET | Freq: Two times a day (BID) | ORAL | Status: DC

## 2015-09-03 MED ORDER — OXYCODONE-ACETAMINOPHEN 5-325 MG PO TABS: 1.0000 | ORAL_TABLET | ORAL | Status: DC | PRN

## 2015-09-03 NOTE — Progress Notes (Signed)
Subjective: 2 Days Post-Op Procedure(s) (LRB): LEFT TOTAL HIP ARTHROPLASTY ANTERIOR APPROACH (Left) Patient reports pain as moderate.  Improving daily with mobility.  Acute blood loss anemia from surgery, bu asymptomatic.  Objective: Vital signs in last 24 hours: Temp:  [99.1 F (37.3 C)-99.2 F (37.3 C)] 99.2 F (37.3 C) (10/20 0504) Pulse Rate:  [95-101] 95 (10/20 0504) Resp:  [16-18] 16 (10/20 0504) BP: (122-128)/(49-52) 128/52 mmHg (10/20 1015) SpO2:  [92 %-100 %] 94 % (10/20 0504)  Intake/Output from previous day:   Intake/Output this shift: Total I/O In: 240 [P.O.:240] Out: -    Recent Labs  09/02/15 0405 09/03/15 0321  HGB 10.4* 9.8*    Recent Labs  09/02/15 0405 09/03/15 0321  WBC 7.5 8.7  RBC 3.61* 3.39*  HCT 32.2* 30.5*  PLT 156 125*    Recent Labs  09/02/15 0405  NA 134*  K 4.2  CL 101  CO2 24  BUN 16  CREATININE 0.81  GLUCOSE 224*  CALCIUM 7.9*   No results for input(s): LABPT, INR in the last 72 hours.  Sensation intact distally Intact pulses distally Dorsiflexion/Plantar flexion intact Incision: dressing C/D/I No cellulitis present Compartment soft  Assessment/Plan: 2 Days Post-Op Procedure(s) (LRB): LEFT TOTAL HIP ARTHROPLASTY ANTERIOR APPROACH (Left) Up with therapy Plan for discharge tomorrow Discharge home with home health  [name] 09/03/2015, 11:13 AM

## 2015-09-03 NOTE — Discharge Instructions (Signed)

## 2015-09-03 NOTE — Progress Notes (Signed)
Inpatient Diabetes Program Recommendations  AACE/ADA: New Consensus Statement on Inpatient Glycemic Control (2015)  Target Ranges:  Prepandial:   less than 140 mg/dL      Peak postprandial:   less than 180 mg/dL (1-2 hours)      Critically ill patients:  140 - 180 mg/dL   Review of Glycemic Control:  Results for LATREASE, BRUMBY (MRN [id number]) as of 09/03/2015 12:33  Ref. Range 09/02/2015 11:00 09/02/2015 16:34 09/02/2015 23:28 09/03/2015 06:35 09/03/2015 11:43  Glucose-Capillary Latest Ref Range: 65-99 mg/dL 256 (H) 389 (H) 373 (H) 192 (H) 208 (H)  Results for KISHA, KRISS (MRN [id number]) as of 09/03/2015 12:33  Ref. Range 08/21/2015 16:01  Hemoglobin A1C Latest Ref Range: 4.8-5.6 % 8.1 (H)   Diabetes history: Diabetes Type 2 Outpatient Diabetes medications: Metformin 500 mg daily Current orders for Inpatient glycemic control:  Metformin 500 mg daily  Inpatient Diabetes Program Recommendations:   Novolog moderate tid with meals  Blood sugars are greater than goal.  Consider adding Lantus 15 units daily while patient is in the hospital. Likely she will need adjustment of home diabetes medications due to high A1C.  Thanks, Beryl Meager, RN, BC-ADM Inpatient Diabetes Coordinator Pager [phone] (8a-5p)

## 2015-09-03 NOTE — Progress Notes (Signed)
Occupational Therapy Treatment Patient Details Name: Robin Vaughn MRN: [id number] DOB: [birth year] Today's Date: 09/03/2015    History of present illness Lt direct anterior THA, PMH: diabetes, hypertension   OT comments  Pt making gradual progress toward OT goals. Focus of session today was on use of AE for independence with LB ADLs. Educated pt on use of AE; pt demonstrated understanding of reacher and sock aide. D/c plan remains appropriate at this time; pt would continue to benefit from [facility] upon return home. Will continue to follow pt acutely.    Follow Up Recommendations  Home health OT;Supervision - Intermittent    Equipment Recommendations  Other (comment) (AE: lh sponge, lh shoe horn, reacher, sock aide)    Recommendations for Other Services      Precautions / Restrictions Precautions Precautions: Fall Restrictions Weight Bearing Restrictions: Yes LLE Weight Bearing: Weight bearing as tolerated       Mobility Bed Mobility               General bed mobility comments: Pt on BSC.  Transfers                      Balance Overall balance assessment: Needs assistance Sitting-balance support: No upper extremity supported;Feet supported Sitting balance-Leahy Scale: Poor                             ADL Overall ADL's : Needs assistance/impaired                     Lower Body Dressing: Minimal assistance;Sit to/from stand Lower Body Dressing Details (indicate cue type and reason): Educated pt on LB ADLs with AE; pt demonstrated ability to don/doff socks using reacher and sock aide. Pt verbalized understanding of lh sponge and lh shoe horn. Educated pt on technique for donning LB clothing using reacher; pt verbalized understanding and declined to practice due to needing to have a BM.               General ADL Comments: No family present during OT eval. Eduated on compensatory strategies for LB ADLs and use of AE for LB ADLs.        Vision                     Perception     Praxis      Cognition   Behavior During Therapy: WFL for tasks assessed/performed Overall Cognitive Status: Within Functional Limits for tasks assessed                       Extremity/Trunk Assessment               Exercises     Shoulder Instructions       General Comments      Pertinent Vitals/ Pain       Pain Assessment: Faces Faces Pain Scale: Hurts even more Pain Location: L hip Pain Descriptors / Indicators: Grimacing;Guarding;Sore Pain Intervention(s): Limited activity within patient's tolerance;Monitored during session;Ice applied  Home Living                                          Prior Functioning/Environment              Frequency Min 2X/week  Progress Toward Goals  OT Goals(current goals can now be found in the care plan section)  Progress towards OT goals: Progressing toward goals  Acute Rehab OT Goals Patient Stated Goal: none stated  Plan Discharge plan remains appropriate    Co-evaluation                 End of Session Equipment Utilized During Treatment: Other (comment) (AE)   Activity Tolerance Patient tolerated treatment well   Patient Left with call bell/phone within reach;Other (comment) (on [facility] )   Nurse Communication          Time: 1030-1314 OT Time Calculation (min): 12 min  Charges: OT General Charges $OT Visit: 1 Procedure OT Treatments $Self Care/Home Management : 8-22 mins  Gaye Alken M.S., OTR/L Pager: [phone]  09/03/2015, 10:36 AM

## 2015-09-03 NOTE — Progress Notes (Signed)
Physical Therapy Treatment Patient Details Name: Robin Vaughn MRN: [id number] DOB: 1940/12/08 Today's Date: 09/03/2015    History of Present Illness Lt direct anterior THA, PMH: diabetes, hypertension    PT Comments    Patient slow with mobilization at this time but able to increase ambulation to 25 feet despite heavy compensations by patient despite cues for safe and stable gait. Patient reporting mobility is limited by pain in the Lt hip region. Discussed D/C plan with patient and she states that she feels confident with going home to her daughter's home despite her slow mobilization. Also discussed stairs which will need to be attempted prior to D/C as well.   Follow Up Recommendations  Home health PT;Supervision for mobility/OOB     Equipment Recommendations  None recommended by PT    Recommendations for Other Services       Precautions / Restrictions Precautions Precautions: Fall Restrictions Weight Bearing Restrictions: Yes LLE Weight Bearing: Weight bearing as tolerated    Mobility  Bed Mobility               General bed mobility comments: up in chair upon arrival  Transfers Overall transfer level: Needs assistance Equipment used: Rolling walker (2 wheeled) Transfers: Sit to/from Stand Sit to Stand: Min assist         General transfer comment: heavy dependence on Rt LE, minimal use of Lt LE despite cues. Heavy lean to right with sitting and transfers.   Ambulation/Gait Ambulation/Gait assistance: Min assist Ambulation Distance (Feet): 25 Feet Assistive device: Rolling walker (2 wheeled) Gait Pattern/deviations: Decreased stance time - left;Decreased dorsiflexion - left;Decreased weight shift to left;Trunk flexed Gait velocity: decreased   General Gait Details: Very compensated gait pattern despite cues. Multiple compensations attempted by patient despite cues  including "crawling" Lt foot forward with toes, swinging leg, physical assist by  therapist. Patient reporting that she is unable to advace her LLE forward due to pain. After physical assistance, patient was able to independently advance with decreased compensations.    Stairs            Wheelchair Mobility    Modified Rankin (Stroke Patients Only)       Balance Overall balance assessment: Needs assistance Sitting-balance support: Feet supported Sitting balance-Leahy Scale: Fair Sitting balance - Comments: heavy lean to rt side   Standing balance support: Bilateral upper extremity supported Standing balance-Leahy Scale: Poor Standing balance comment: using rw                    Cognition Arousal/Alertness: Awake/alert Behavior During Therapy: WFL for tasks assessed/performed Overall Cognitive Status: Within Functional Limits for tasks assessed                      Exercises Total Joint Exercises Ankle Circles/Pumps: AROM;Both;10 reps Quad Sets: Strengthening;Left;10 reps Gluteal Sets: Both;Strengthening;10 reps Heel Slides: Left;AAROM;10 reps Hip ABduction/ADduction: Strengthening;Left;10 reps;Other (comment) (mod assist)    General Comments        Pertinent Vitals/Pain Pain Assessment: Faces Faces Pain Scale: Hurts even more Pain Location: Lt hip Pain Descriptors / Indicators: Guarding;Grimacing Pain Intervention(s): Monitored during session;Limited activity within patient's tolerance    Home Living                      Prior Function            PT Goals (current goals can now be found in the care plan section) Acute Rehab PT Goals  Patient Stated Goal: go to daughters home tomorrow PT Goal Formulation: With patient Time For Goal Achievement: 09/16/15 Potential to Achieve Goals: Good Progress towards PT goals: Progressing toward goals    Frequency  7X/week    PT Plan Current plan remains appropriate    Co-evaluation             End of Session Equipment Utilized During Treatment: Gait  belt Activity Tolerance: Patient limited by fatigue;Patient limited by pain Patient left: in chair;with call bell/phone within reach     Time: 1145-1212 PT Time Calculation (min) (ACUTE ONLY): 27 min  Charges:  $Gait Training: 8-22 mins $Therapeutic Exercise: 8-22 mins                    G Codes:      Christiane Ha, PT, CSCS Pager [phone] Office [phone]  09/03/2015, 4:13 PM

## 2015-09-04 LAB — GLUCOSE, CAPILLARY
Glucose-Capillary: 181 mg/dL — ABNORMAL HIGH (ref 65–99)
Glucose-Capillary: 187 mg/dL — ABNORMAL HIGH (ref 65–99)

## 2015-09-04 NOTE — Discharge Summary (Signed)
Physician Discharge Summary  Patient ID: Robin Vaughn MRN: [id number] DOB/AGE: 1941-08-03 74 y.o.  Admit date: 09/01/2015 Discharge date: 09/04/2015  Admission Diagnoses: Osteoarthritis left hip  Discharge Diagnoses:  Principal Problem:   Osteoarthritis of left hip Active Problems:   Status post total replacement of left hip   Discharged Condition: stable  Hospital Course: Patient's hospital course was essentially unremarkable. Gentleman total hip arthroplasty was discharged to home in stable condition.  Consults: None  Significant Diagnostic Studies: labs: Routine labs   Treatments: surgery: See operative note  Discharge Exam: Blood pressure 122/49, pulse 94, temperature 98.6 F (37 C), temperature source Oral, resp. rate 17, height 5' 1.5" (1.562 m), weight 92.987 kg (205 lb), SpO2 94 %. Incision/Wound: clean and dry  Disposition: 01-Home or Self Care  Discharge Instructions    Call MD / Call 911    Complete by:  As directed   If you experience chest pain or shortness of breath, CALL 911 and be transported to the hospital emergency room.  If you develope a fever above 101 F, pus (white drainage) or increased drainage or redness at the wound, or calf pain, call your surgeon's office.     Constipation Prevention    Complete by:  As directed   Drink plenty of fluids.  Prune juice may be helpful.  You may use a stool softener, such as Colace (over the counter) 100 mg twice a day.  Use MiraLax (over the counter) for constipation as needed.     Diet - low sodium heart healthy    Complete by:  As directed      Increase activity slowly as tolerated    Complete by:  As directed             Medication List    TAKE these medications        alendronate 70 MG tablet  Commonly known as:  FOSAMAX  Take 70 mg by mouth once a week. On Monday.     amLODipine-benazepril 5-10 MG capsule  Commonly known as:  LOTREL  TAKE ONE CAPSULE BY MOUTH ONCE DAILY     aspirin 325 MG  EC tablet  Take 1 tablet (325 mg total) by mouth 2 (two) times daily after a meal.     CO Q-10 PO  Take 200 mg by mouth 3 (three) times a week.     CRANBERRY PO  Take 1 capsule by mouth 3 (three) times a week.     ESSENTIAL ONE DAILY PO  Take 1 tablet by mouth 3 (three) times a week.     GLUCOSAMINE CHONDR COMPLEX PO  Take 1 tablet by mouth daily.     LEG VEIN & CIRCULATION PO  Take 1 tablet by mouth daily.     metFORMIN 500 MG tablet  Commonly known as:  GLUCOPHAGE  Take 500 mg by mouth daily.     methocarbamol 500 MG tablet  Commonly known as:  ROBAXIN  Take 1 tablet (500 mg total) by mouth every 6 (six) hours as needed for muscle spasms.     ondansetron 4 MG disintegrating tablet  Commonly known as:  ZOFRAN ODT  Take 1 tablet (4 mg total) by mouth every 8 (eight) hours as needed for nausea.     OVER THE COUNTER MEDICATION  Take 2 capsules by mouth daily. "Basis Nicotinamide Riboside"     oxyCODONE-acetaminophen 5-325 MG tablet  Commonly known as:  ROXICET  Take 1-2 tablets by mouth every 4 (four)  hours as needed.     PHOSPHATIDYL CHOLINE PO  Take 1 capsule by mouth 2 (two) times a week.     PROBIOTIC PO  Take 1 capsule by mouth daily.     Resveratrol 100 MG Caps  Take 100 mg by mouth daily.     TURMERIC PO  Take 1 capsule by mouth daily.     VITAMIN D-3 PO  Take 1 capsule by mouth 2 (two) times a week.           Follow-up Information    Follow up with [facility].   Why:  THey will contact you to schedule home therapy visits.   Contact information:   [address] [place] [address] [phone]       Follow up with [name], MD In 2 weeks.   Specialty:  Orthopedic Surgery   Contact information:   [address] [place] [address] [phone]       Signed: Nadara Mustard 09/04/2015, 7:01 AM

## 2015-09-04 NOTE — Progress Notes (Signed)
Physical Therapy Treatment Patient Details Name: Robin Vaughn MRN: [id number] DOB: 1941-06-07 Today's Date: 09/04/2015    History of Present Illness Lt direct anterior THA, PMH: diabetes, hypertension    PT Comments    Patient reporting that she is going to be going home later today to stay with her daughter. During session the patient did demonstrate mobility techniques which would increase her risk of falling. These were brought to her attention along with safer alternatives. Patient reported to therapist that she knows how her body works best and she will continue to do it her way. This was noted with transfers, ambulation and stairs. At the completion of the session the patient reported that she feels confident with her mobility and that she will be fine going home. She denied any questions or concerns at the conclusion of session.    Follow Up Recommendations  Home health PT;Supervision for mobility/OOB;Other (comment) (reviewed recommendations with patient)     Equipment Recommendations  None recommended by PT    Recommendations for Other Services       Precautions / Restrictions Precautions Precautions: Fall Precaution Comments: reviewed safety with stairs, ambulation and transfers. Patient reports that she knows how to move and feels confident with how she is going to do it.  Restrictions Weight Bearing Restrictions: Yes LLE Weight Bearing: Weight bearing as tolerated    Mobility  Bed Mobility Overal bed mobility: Needs Assistance Bed Mobility: Sit to Supine       Sit to supine: Mod assist (bilateral LEs)   General bed mobility comments: Patient refusing to attempt to return to supine without assistance, states that her daughter will help her.   Transfers Overall transfer level: Needs assistance Equipment used: Rolling walker (2 wheeled) Transfers: Sit to/from Stand Sit to Stand: Min guard         General transfer comment: repeated cues throughout for  safety, patient pulling on walker with forearms. Educated on fall risk with this technique, improved use of hands following although not consistently.   Ambulation/Gait Ambulation/Gait assistance: Min guard Ambulation Distance (Feet): 30 Feet Assistive device: Rolling walker (2 wheeled) Gait Pattern/deviations: Step-to pattern;Decreased stance time - left;Decreased weight shift to left Gait velocity: decreased   General Gait Details: able to advance LLE without assistance today, poor weightbearing through LLE despite cues.    Stairs Stairs: Yes Stairs assistance: Min assist Stair Management: No rails;Step to pattern;With crutches (crutches per patient request, refusing any other options) Number of Stairs: 2 General stair comments: Assistance of 2 needed on stairs for safety along with cues for sequence. Strongly recommeding having 2 person assistance at home. Patient refusing to attempt stairs with any other device or a second time to improve safety.   Wheelchair Mobility    Modified Rankin (Stroke Patients Only)       Balance Overall balance assessment: Needs assistance Sitting-balance support: No upper extremity supported Sitting balance-Leahy Scale: Good     Standing balance support: Bilateral upper extremity supported Standing balance-Leahy Scale: Poor Standing balance comment: using rw                    Cognition Arousal/Alertness: Awake/alert Behavior During Therapy: WFL for tasks assessed/performed Overall Cognitive Status: Within Functional Limits for tasks assessed                      Exercises      General Comments        Pertinent Vitals/Pain Pain Assessment: No/denies pain  Pain Intervention(s): Limited activity within patient's tolerance;Monitored during session;Other (comment) (states not in any pain right now)    Home Living                      Prior Function            PT Goals (current goals can now be found in  the care plan section) Acute Rehab PT Goals Patient Stated Goal: go home to daughter's home today PT Goal Formulation: With patient Time For Goal Achievement: 09/16/15 Potential to Achieve Goals: Good Progress towards PT goals: Progressing toward goals    Frequency  7X/week    PT Plan Current plan remains appropriate    Co-evaluation             End of Session Equipment Utilized During Treatment: Gait belt Activity Tolerance: Patient limited by fatigue Patient left: in bed;with call bell/phone within reach     Time: 0831-0905 PT Time Calculation (min) (ACUTE ONLY): 34 min  Charges:  $Gait Training: 23-37 mins                    G Codes:      Christiane Ha, PT, CSCS Pager [phone] Office [phone]  09/04/2015, 10:45 AM

## 2015-09-04 NOTE — Care Management Important Message (Signed)
Important Message  Patient Details  Name: Robin Vaughn MRN: [id number] Date of Birth: [birth year]   Medicare Important Message Given:  Yes-second notification given    Kyla Balzarine 09/04/2015, 12:22 PM

## 2015-12-13 ENCOUNTER — Encounter (HOSPITAL_BASED_OUTPATIENT_CLINIC_OR_DEPARTMENT_OTHER): Payer: Self-pay

## 2015-12-13 ENCOUNTER — Emergency Department (HOSPITAL_BASED_OUTPATIENT_CLINIC_OR_DEPARTMENT_OTHER)
Admission: EM | Admit: 2015-12-13 | Discharge: 2015-12-13 | Disposition: A | Discharge status: Home / Self Care | Payer: Medicare Other | Attending: Emergency Medicine | Admitting: Emergency Medicine

## 2015-12-13 DIAGNOSIS — L0201 Cutaneous abscess of face: Secondary | ICD-10-CM

## 2015-12-13 DIAGNOSIS — I10 Essential (primary) hypertension: Secondary | ICD-10-CM | POA: Insufficient documentation

## 2015-12-13 DIAGNOSIS — Z7984 Long term (current) use of oral hypoglycemic drugs: Secondary | ICD-10-CM | POA: Insufficient documentation

## 2015-12-13 DIAGNOSIS — Z79899 Other long term (current) drug therapy: Secondary | ICD-10-CM | POA: Diagnosis not present

## 2015-12-13 DIAGNOSIS — Z8709 Personal history of other diseases of the respiratory system: Secondary | ICD-10-CM | POA: Insufficient documentation

## 2015-12-13 DIAGNOSIS — Z8719 Personal history of other diseases of the digestive system: Secondary | ICD-10-CM | POA: Insufficient documentation

## 2015-12-13 DIAGNOSIS — E1165 Type 2 diabetes mellitus with hyperglycemia: Secondary | ICD-10-CM | POA: Diagnosis not present

## 2015-12-13 DIAGNOSIS — Z8701 Personal history of pneumonia (recurrent): Secondary | ICD-10-CM | POA: Diagnosis not present

## 2015-12-13 DIAGNOSIS — M1990 Unspecified osteoarthritis, unspecified site: Secondary | ICD-10-CM | POA: Diagnosis not present

## 2015-12-13 DIAGNOSIS — Z880 Allergy status to penicillin: Secondary | ICD-10-CM | POA: Diagnosis not present

## 2015-12-13 DIAGNOSIS — Z7982 Long term (current) use of aspirin: Secondary | ICD-10-CM | POA: Insufficient documentation

## 2015-12-13 LAB — CBG MONITORING, ED: Glucose-Capillary: 221 mg/dL — ABNORMAL HIGH (ref 65–99)

## 2015-12-13 MED ORDER — LIDOCAINE-EPINEPHRINE 2 %-1:100000 IJ SOLN
INTRAMUSCULAR | Status: AC
  Filled 2015-12-13: qty 1

## 2015-12-13 MED ORDER — LIDOCAINE-EPINEPHRINE 2 %-1:100000 IJ SOLN
1.7000 mL | Freq: Once | INTRAMUSCULAR | Status: AC
  Administered 2015-12-13: 1 mL via INTRADERMAL

## 2015-12-13 MED ORDER — DOXYCYCLINE HYCLATE 100 MG PO CAPS: 100.0000 mg | ORAL_CAPSULE | Freq: Two times a day (BID) | ORAL | Status: DC

## 2015-12-13 NOTE — ED Notes (Signed)
Patient here with right sided facial redness and swelling. Other small areas noted to left side of face, tried to squeeze the right sided "pimple". Complains of soreness to same

## 2015-12-13 NOTE — ED Notes (Signed)
Area cleaned and bandaid applied all per EDP orders

## 2015-12-13 NOTE — Discharge Instructions (Signed)
Abscess Place a warm moist washcloth over the swollen area on the right side of your face 4 times daily for 30 minutes at a time. Take Tylenol for pain as directed. Take the antibiotic as prescribed. Call Dr. Timothy Lasso to be seen in the office if not improving in 2 or 3 days. If you continue to get a similar rash, you may need referral to dermatologist. Return if you develop fever, worsening pain, trouble with vision or if you feel worse for any reason. An abscess (boil or furuncle) is an infected area on or under the skin. This area is filled with yellowish-white fluid (pus) and other material (debris). HOME CARE   Only take medicines as told by your doctor.  If you were given antibiotic medicine, take it as directed. Finish the medicine even if you start to feel better.  If gauze is used, follow your doctor's directions for changing the gauze.  To avoid spreading the infection:  Keep your abscess covered with a bandage.  Wash your hands well.  Do not share personal care items, towels, or whirlpools with others.  Avoid skin contact with others.  Keep your skin and clothes clean around the abscess.  Keep all doctor visits as told. GET HELP RIGHT AWAY IF:   You have more pain, puffiness (swelling), or redness in the wound site.  You have more fluid or blood coming from the wound site.  You have muscle aches, chills, or you feel sick.  You have a fever. MAKE SURE YOU:   Understand these instructions.  Will watch your condition.  Will get help right away if you are not doing well or get worse.   This information is not intended to replace advice given to you by your health care provider. Make sure you discuss any questions you have with your health care provider.   Document Released: 04/18/2008 Document Revised: 05/01/2012 Document Reviewed: 01/14/2012 Elsevier Interactive Patient Education [employer]

## 2015-12-13 NOTE — ED Provider Notes (Signed)
CSN: [id number]     Arrival date & time 12/13/15  1884 History   First MD Initiated Contact with Patient 12/13/15 [phone]     Chief Complaint  Patient presents with  . facial abscess      (Consider location/radiation/quality/duration/timing/severity/associated sxs/prior Treatment) HPI Complaints of multiple bumps on her face over the past 2 weeks that are like "pimples" which have cleared up without treatment. She has one on her right cheek noted 3 days ago which started off with a mildly painful bump with a "whitehead" she squeezed a slight amount of pus out of it but the area has become more swollen and painful today. She denies fever denies other symptoms no pain on Color movement. No other associated symptoms Past Medical History  Diagnosis Date  . Hypertension   . Bronchitis   . GERD (gastroesophageal reflux disease)   . Exertional dyspnea   . Atypical chest pain     Nuc/GXT 05/02/2006--Post Ejection Fraction 83%---rare PVC's with stress  . History of nephritis   . Pneumonia   . Complication of anesthesia     Pt BP dropped during hernia surgery at age 3. Pt stated "I almost died"  . Family history of adverse reaction to anesthesia     Pts cousin was given anesthesia and he could not speak after anesthesia, although he was conscious  . Diabetes mellitus without complication (HCC)     Type 2  . Frequency of urination   . IBS (irritable bowel syndrome)     Hx of  . Diarrhea     D/t metformin  . Arthritis    Past Surgical History  Procedure Laterality Date  . Hernia repair    . Carpal tunnel release Bilateral   . Cataract extraction Bilateral   . Shoulder surgery Left   . Colonoscopy w/ polypectomy    . Nasal sinus surgery    . Total hip arthroplasty Left 09/01/2015    Procedure: LEFT TOTAL HIP ARTHROPLASTY ANTERIOR APPROACH;  Surgeon: [name], MD;  Location: [facility] OR;  Service: Orthopedics;  Laterality: Left;  NEEDS RNFA   Family History  Problem Relation  Age of Onset  . Diabetes Mother     Borderline in 39's  . Lung cancer Father    Social History  Substance Use Topics  . Smoking status: Never Smoker   . Smokeless tobacco: Never Used  . Alcohol Use: No   OB History    No data available     Review of Systems  Skin: Positive for rash.  Allergic/Immunologic: Positive for immunocompromised state.       Diabetic  All other systems reviewed and are negative.     Allergies  Penicillins cross reactors  Home Medications   Prior to Admission medications   Medication Sig Start Date End Date Taking? Authorizing Provider  alendronate (FOSAMAX) 70 MG tablet Take 70 mg by mouth once a week. On Monday.    Historical Provider, MD  amLODipine-benazepril (LOTREL) 5-10 MG per capsule TAKE ONE CAPSULE BY MOUTH ONCE DAILY 10/11/13   Runell Gess, MD  aspirin EC 325 MG EC tablet Take 1 tablet (325 mg total) by mouth 2 (two) times daily after a meal. 09/03/15   [name], MD  Cholecalciferol (VITAMIN D-3 PO) Take 1 capsule by mouth 2 (two) times a week.    Historical Provider, MD  Coenzyme Q10 (CO Q-10 PO) Take 200 mg by mouth 3 (three) times a week.     Historical  Provider, MD  CRANBERRY PO Take 1 capsule by mouth 3 (three) times a week.    Historical Provider, MD  Glucosamine-Chondroitin (GLUCOSAMINE CHONDR COMPLEX PO) Take 1 tablet by mouth daily.    Historical Provider, MD  metFORMIN (GLUCOPHAGE) 500 MG tablet Take 500 mg by mouth daily.    Historical Provider, MD  methocarbamol (ROBAXIN) 500 MG tablet Take 1 tablet (500 mg total) by mouth every 6 (six) hours as needed for muscle spasms. 09/03/15   [name], MD  Misc Natural Products (LEG VEIN & CIRCULATION PO) Take 1 tablet by mouth daily.    Historical Provider, MD  Multiple Vitamin (ESSENTIAL ONE DAILY PO) Take 1 tablet by mouth 3 (three) times a week.    Historical Provider, MD  ondansetron (ZOFRAN ODT) 4 MG disintegrating tablet Take 1 tablet (4 mg total) by  mouth every 8 (eight) hours as needed for nausea. Patient not taking: Reported on 08/20/2015 12/06/12   Loren Racer, MD  OVER THE COUNTER MEDICATION Take 2 capsules by mouth daily. "Basis Nicotinamide Riboside"    Historical Provider, MD  oxyCODONE-acetaminophen (ROXICET) 5-325 MG tablet Take 1-2 tablets by mouth every 4 (four) hours as needed. 09/03/15   [name], MD  PHOSPHATIDYL CHOLINE PO Take 1 capsule by mouth 2 (two) times a week.    Historical Provider, MD  Probiotic Product (PROBIOTIC PO) Take 1 capsule by mouth daily.    Historical Provider, MD  Resveratrol 100 MG CAPS Take 100 mg by mouth daily.    Historical Provider, MD  TURMERIC PO Take 1 capsule by mouth daily.    Historical Provider, MD   BP 164/80 mmHg  Pulse 95  Temp(Src) 99.2 F (37.3 C) (Oral)  Resp 20  Ht  (1.575 m)  Wt 195 lb (88.451 kg)  BMI 35.66 kg/m2  SpO2 99% Physical Exam  Constitutional: She appears well-developed and well-nourished. No distress.  HENT:  Head: Normocephalic and atraumatic.  Right Ear: External ear normal.  Left Ear: External ear normal.  Mouth/Throat: Oropharynx is clear and moist.  0.5 cm abscess right cheek  Eyes: Conjunctivae are normal. Pupils are equal, round, and reactive to light.  Neck: Neck supple. No tracheal deviation present. No thyromegaly present.  Cardiovascular: Normal rate and regular rhythm.   No murmur heard. Pulmonary/Chest: Effort normal and breath sounds normal.  Abdominal: Soft. Bowel sounds are normal. She exhibits no distension. There is no tenderness.  Musculoskeletal: Normal range of motion. She exhibits no edema or tenderness.  Lymphadenopathy:    She has no cervical adenopathy.  Neurological: She is alert. Coordination normal.  Skin: Skin is warm and dry. No rash noted.  Psychiatric: She has a normal mood and affect.  Nursing note and vitals reviewed.   ED Course  Procedures (including critical care time) Labs Review Labs  Reviewed - No data to display  Imaging Review No results found. I have personally reviewed and evaluated these images and lab results as part of my medical decision-making.   EKG Interpretation None     INCISION AND DRAINAGE Performed by: Doug Sou Consent: Verbal consent obtained. Risks and benefits: risks, benefits and alternatives were discussed Type: abscess  Body area: right cheek  Anesthesia: local infiltration  2%lidocaine with epi  Incision was made with a scalpel.  Local anesthetic: lidocaine2 % with epinephrine  Anesthetic total: 1 ml  Complexity:simple   Drainage: purulent  Drainage amount: minimal    Patient tolerance: Patient tolerated the procedure well with no  immediate complications.   MDM  Plan prescription doxycycline. Warm compresses Tylenol for pain. Follow-up with Dr. Timothy Lasso if not improving in 2 or 3 days. Return for fever or feels worse for any reason Final diagnoses:  None   Patient with acneiform rash however she had approximately dime-sized abscess at right cheek which was incised and drained today Diagnosis#1 facial abscess #2 hypeglycemia     Doug Sou, MD 12/13/15 0945

## 2015-12-13 NOTE — ED Notes (Signed)
Presents with red, swollen area at upper rt cheek. Has tried OTC and warm compresses to area, Thursday noted sm red area

## 2015-12-13 NOTE — ED Notes (Signed)
DC instructions provided to pt along with abx prescription written by EDP. Discussed handwashing before and after bandage changes, importance of completing abx as prescribed by EDP and the need for follow up appoint as recommend if needed by EDP

## 2016-06-09 ENCOUNTER — Emergency Department (HOSPITAL_BASED_OUTPATIENT_CLINIC_OR_DEPARTMENT_OTHER): Payer: Medicare Other

## 2016-06-09 ENCOUNTER — Encounter (HOSPITAL_BASED_OUTPATIENT_CLINIC_OR_DEPARTMENT_OTHER): Payer: Self-pay | Admitting: *Deleted

## 2016-06-09 ENCOUNTER — Emergency Department (HOSPITAL_BASED_OUTPATIENT_CLINIC_OR_DEPARTMENT_OTHER)
Admission: EM | Admit: 2016-06-09 | Discharge: 2016-06-09 | Disposition: A | Discharge status: Home / Self Care | Payer: Medicare Other | Attending: Emergency Medicine | Admitting: Emergency Medicine

## 2016-06-09 DIAGNOSIS — Z7982 Long term (current) use of aspirin: Secondary | ICD-10-CM | POA: Diagnosis not present

## 2016-06-09 DIAGNOSIS — Z79899 Other long term (current) drug therapy: Secondary | ICD-10-CM | POA: Insufficient documentation

## 2016-06-09 DIAGNOSIS — Y939 Activity, unspecified: Secondary | ICD-10-CM | POA: Insufficient documentation

## 2016-06-09 DIAGNOSIS — E119 Type 2 diabetes mellitus without complications: Secondary | ICD-10-CM | POA: Insufficient documentation

## 2016-06-09 DIAGNOSIS — I10 Essential (primary) hypertension: Secondary | ICD-10-CM | POA: Diagnosis not present

## 2016-06-09 DIAGNOSIS — Y999 Unspecified external cause status: Secondary | ICD-10-CM | POA: Diagnosis not present

## 2016-06-09 DIAGNOSIS — Z7984 Long term (current) use of oral hypoglycemic drugs: Secondary | ICD-10-CM | POA: Insufficient documentation

## 2016-06-09 DIAGNOSIS — S0990XA Unspecified injury of head, initial encounter: Secondary | ICD-10-CM | POA: Diagnosis present

## 2016-06-09 DIAGNOSIS — Y929 Unspecified place or not applicable: Secondary | ICD-10-CM | POA: Diagnosis not present

## 2016-06-09 DIAGNOSIS — R42 Dizziness and giddiness: Secondary | ICD-10-CM
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
## 2016-07-07 LAB — IFOBT (OCCULT BLOOD): IFOBT: NEGATIVE

## 2016-08-31 ENCOUNTER — Ambulatory Visit (INDEPENDENT_AMBULATORY_CARE_PROVIDER_SITE_OTHER): Payer: Medicare Other | Admitting: Orthopaedic Surgery

## 2016-08-31 DIAGNOSIS — M1612 Unilateral primary osteoarthritis, left hip: Secondary | ICD-10-CM

## 2016-08-31 DIAGNOSIS — M25552 Pain in left hip: Secondary | ICD-10-CM

## 2016-10-27 IMAGING — RF DG HIP (WITH PELVIS) OPERATIVE*L*
1 series · 2 of 2 positions shown · non-contrast
Comparison: None.

CLINICAL DATA: Left total hip arthroplasty

EXAM:
OPERATIVE LEFT HIP (WITH PELVIS IF PERFORMED) 2 VIEWS
TECHNIQUE: Fluoroscopic spot image(s) were submitted for interpretation
post-operatively.

[Series 1: run · 2 of 2 slices shown]
[im 1/2]
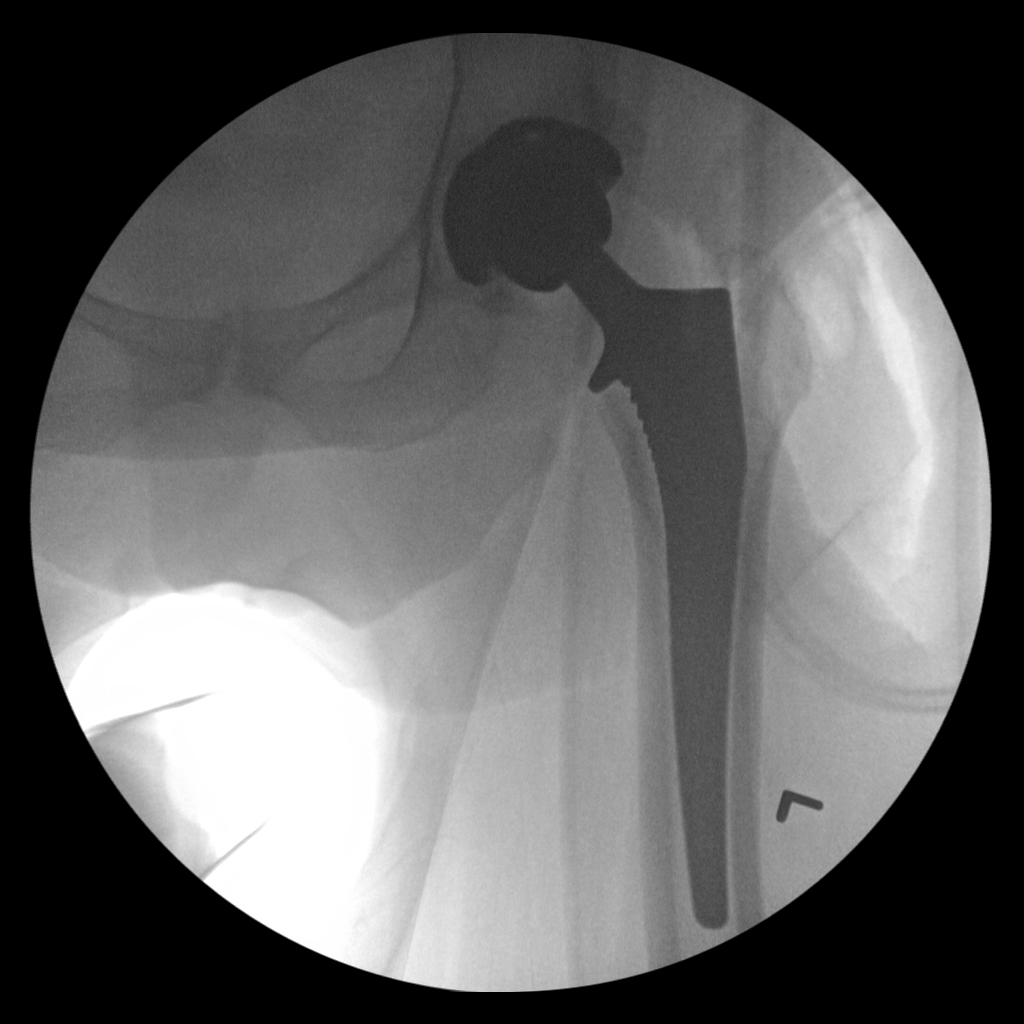
[im 2/2]
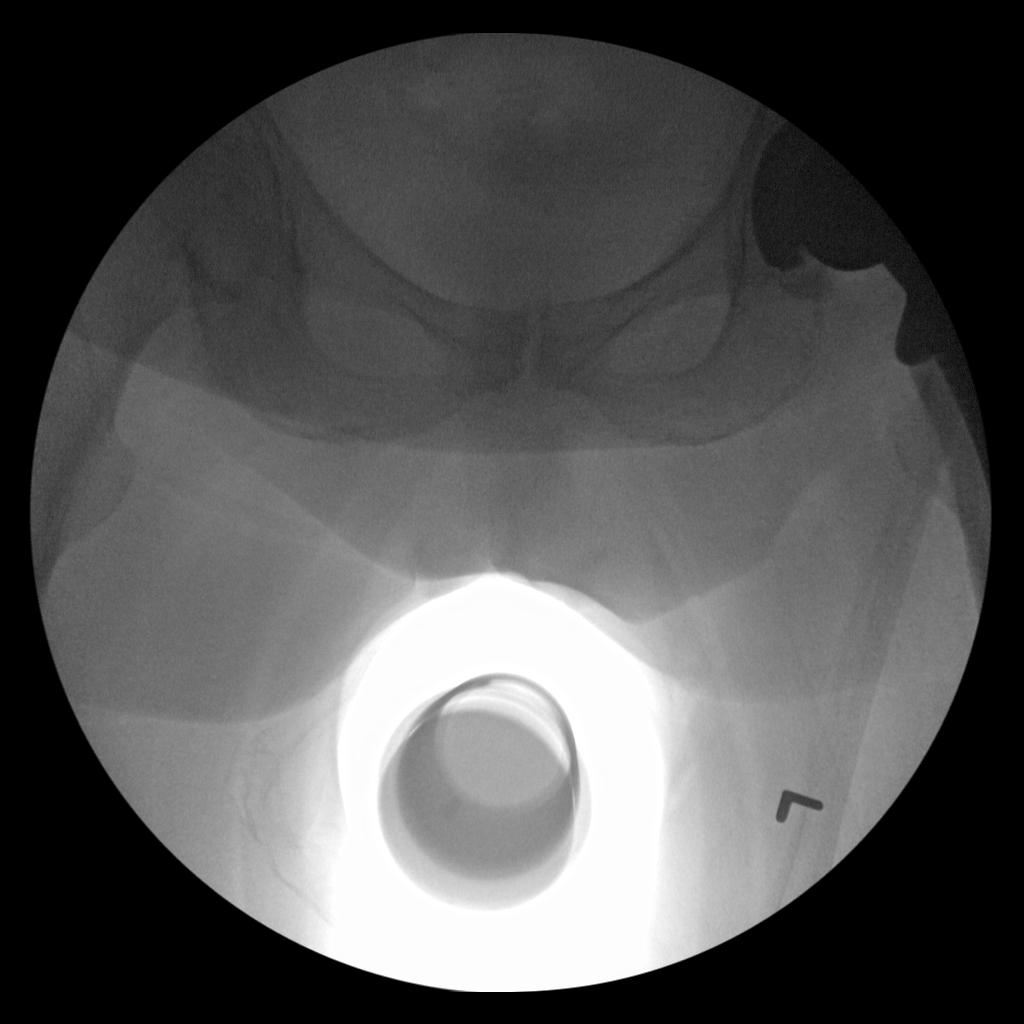

[2 of 2 positions shown; findings below may reference images not displayed]

FINDINGS: Left total hip arthroplasty is anatomically aligned. No breakage or
loosening of the hardware.
IMPRESSION: Left total hip arthroplasty anatomically aligned.

## 2017-01-17 ENCOUNTER — Telehealth: Payer: Self-pay | Admitting: Cardiovascular Disease

## 2017-01-17 NOTE — Telephone Encounter (Signed)
Received records from [facility] for appointment on 01/20/17 with Dr Allyson Sabal.   Records put with Dr Hazle Coca schedule for 01/20/17. lp

## 2017-01-20 ENCOUNTER — Ambulatory Visit (INDEPENDENT_AMBULATORY_CARE_PROVIDER_SITE_OTHER): Payer: Medicare Other | Admitting: Cardiovascular Disease

## 2017-01-20 ENCOUNTER — Encounter: Payer: Self-pay | Admitting: Cardiovascular Disease

## 2017-01-20 VITALS — BP 130/76 | HR 89

## 2017-01-20 DIAGNOSIS — R0609 Other forms of dyspnea: Secondary | ICD-10-CM | POA: Diagnosis not present

## 2017-01-20 DIAGNOSIS — I10 Essential (primary) hypertension: Secondary | ICD-10-CM | POA: Insufficient documentation

## 2017-01-20 DIAGNOSIS — R0789 Other chest pain: Secondary | ICD-10-CM | POA: Insufficient documentation

## 2017-01-20 NOTE — Patient Instructions (Addendum)
Medication Instructions: Your physician recommends that you continue on your current medications as directed. Please refer to the Current Medication list given to you today.   Testing/Procedures: Your physician has requested that you have an exercise stress myoview. For further information please visit [email or web address] Please follow instruction sheet, as given.  Your physician has requested that you have an echocardiogram. Echocardiography is a painless test that uses sound waves to create images of your heart. It provides your doctor with information about the size and shape of your heart and how well your heart's chambers and valves are working. This procedure takes approximately one hour. There are no restrictions for this procedure.  Follow-Up: Your physician recommends that you schedule a follow-up appointment with Dr. Allyson Sabal after testing is completed.  If you need a refill on your cardiac medications before your next appointment, please call your pharmacy.

## 2017-01-20 NOTE — Assessment & Plan Note (Signed)
History of essential hypertension with blood pressure is 130/76. She is on amlodipine and benazepril continue current meds at current dosing

## 2017-01-20 NOTE — Assessment & Plan Note (Signed)
History of recent onset dyspnea on exertion over the last 2 months. Will obtain a 2-D echocardiogram to further evaluate

## 2017-01-20 NOTE — Assessment & Plan Note (Signed)
Robin Vaughn has developed left shoulder upper chest and scapular pain which is fairly constant over the last 2 weeks. She does have positive risk factors. She had a negative Cardiolite stress test performed 05/02/06. Her pain sounds more extra cervical radiculopathy although I am going to get an exercise Myoview stress test to risk stratify her in light of her risk factors and concomitant dyspnea.

## 2017-01-20 NOTE — Progress Notes (Signed)
01/20/2017 Robin Vaughn   Nov 26, 1940  [id number]  Primary Physician Gwen Pounds, MD Primary Cardiologist: Runell Gess MD Roseanne Reno  HPI:  Robin Vaughn is a delightful 76 year old mildly overweight divorced Caucasian female mother of 2 children, grandmother and one grandchild was accompanied by her daughter Robin Vaughn today. I last saw her in the office in 2007. A correlate stress at that time was low risk. She does have a history of treated hypertension and newly diagnosed non-insulin-dependent diabetes. She has a family history of heart disease with a half-brother had coronary artery bypass grafting. She has never had a heart attack or stroke. She relates several months of increasing dyspnea on exertion especially while walking and several weeks of left shoulder arm and scapular and upper chest pain which is somewhat constant and sounds more like cervical radiculopathy.   Current Outpatient Prescriptions  Medication Sig Dispense Refill  . amLODipine-benazepril (LOTREL) 5-10 MG per capsule TAKE ONE CAPSULE BY MOUTH ONCE DAILY 30 capsule 4  . aspirin EC 325 MG EC tablet Take 1 tablet (325 mg total) by mouth 2 (two) times daily after a meal. 30 tablet 0  . Cholecalciferol (VITAMIN D-3 PO) Take 1 capsule by mouth 2 (two) times a week.    . Coenzyme Q10 (CO Q-10 PO) Take 200 mg by mouth 3 (three) times a week.     Marland Kitchen CRANBERRY PO Take 1 capsule by mouth 3 (three) times a week.    . metFORMIN (GLUCOPHAGE) 500 MG tablet Take 500 mg by mouth daily.    . Misc Natural Products (LEG VEIN & CIRCULATION PO) Take 1 tablet by mouth daily.    . Multiple Vitamin (ESSENTIAL ONE DAILY PO) Take 1 tablet by mouth 3 (three) times a week.    Marland Kitchen OVER THE COUNTER MEDICATION Take 2 capsules by mouth daily. "Basis Nicotinamide Riboside"    . Probiotic Product (PROBIOTIC PO) Take 1 capsule by mouth daily.    Marland Kitchen Resveratrol 100 MG CAPS Take 100 mg by mouth daily.    . TURMERIC PO Take 1 capsule by  mouth daily.     No current facility-administered medications for this visit.     Allergies  Allergen Reactions  . Penicillins Cross Reactors Other (See Comments)    Hallucinations and erythema    Social History   Social History  . Marital status: Divorced    Spouse name: N/A  . Number of children: N/A  . Years of education: N/A   Occupational History  . Not on file.   Social History Main Topics  . Smoking status: Never Smoker  . Smokeless tobacco: Never Used  . Alcohol use No  . Drug use: No  . Sexual activity: Not on file   Other Topics Concern  . Not on file   Social History Narrative  . No narrative on file     Review of Systems: General: negative for chills, fever, night sweats or weight changes.  Cardiovascular: negative for chest pain, dyspnea on exertion, edema, orthopnea, palpitations, paroxysmal nocturnal dyspnea or shortness of breath Dermatological: negative for rash Respiratory: negative for cough or wheezing Urologic: negative for hematuria Abdominal: negative for nausea, vomiting, diarrhea, bright red blood per rectum, melena, or hematemesis Neurologic: negative for visual changes, syncope, or dizziness All other systems reviewed and are otherwise negative except as noted above.    Blood pressure 130/76, pulse 89.  General appearance: alert and no distress Neck: no adenopathy, no carotid bruit,  no JVD, supple, symmetrical, trachea midline and thyroid not enlarged, symmetric, no tenderness/mass/nodules Lungs: clear to auscultation bilaterally Heart: regular rate and rhythm, S1, S2 normal, no murmur, click, rub or gallop Extremities: extremities normal, atraumatic, no cyanosis or edema  EKG sinus rhythm 89 without ST or T-wave changes. I personally reviewed this EKG  ASSESSMENT AND PLAN:   Essential hypertension History of essential hypertension with blood pressure is 130/76. She is on amlodipine and benazepril continue current meds at current  dosing  Dyspnea on exertion History of recent onset dyspnea on exertion over the last 2 months. Will obtain a 2-D echocardiogram to further evaluate  Atypical chest pain Ms. Creger has developed left shoulder upper chest and scapular pain which is fairly constant over the last 2 weeks. She does have positive risk factors. She had a negative Cardiolite stress test performed 05/02/06. Her pain sounds more extra cervical radiculopathy although I am going to get an exercise Myoview stress test to risk stratify her in light of her risk factors and concomitant dyspnea.      Runell Gess MD FACP,FACC,FAHA, [facility] 01/20/2017 10:22 AM

## 2017-01-24 ENCOUNTER — Telehealth (HOSPITAL_COMMUNITY): Payer: Self-pay

## 2017-01-24 NOTE — Telephone Encounter (Signed)
Encounter complete. 

## 2017-01-26 ENCOUNTER — Ambulatory Visit (HOSPITAL_COMMUNITY)
Admission: RE | Admit: 2017-01-26 | Discharge: 2017-01-26 | Disposition: A | Discharge status: Home / Self Care | Payer: Medicare Other | Source: Ambulatory Visit | Attending: Cardiology | Admitting: Cardiology

## 2017-01-26 DIAGNOSIS — R0789 Other chest pain: Secondary | ICD-10-CM | POA: Insufficient documentation

## 2017-01-26 DIAGNOSIS — R0609 Other forms of dyspnea: Secondary | ICD-10-CM | POA: Diagnosis present

## 2017-01-26 LAB — MYOCARDIAL PERFUSION IMAGING
CHL CUP MPHR: 146 {beats}/min
CHL CUP NUCLEAR SDS: 4
CHL CUP RESTING HR STRESS: 78 {beats}/min
CHL RATE OF PERCEIVED EXERTION: 18
CSEPED: 4 min
CSEPEDS: 30 s
CSEPHR: 110 %
Estimated workload: 6.4 METS
LV dias vol: 60 mL (ref 46–106)
LV sys vol: 16 mL
Peak HR: 162 {beats}/min
SRS: 0
SSS: 4
TID: 0.93

## 2017-01-26 MED ORDER — TECHNETIUM TC 99M TETROFOSMIN IV KIT
32.4000 | PACK | Freq: Once | INTRAVENOUS | Status: AC | PRN
  Administered 2017-01-26: 32.4 via INTRAVENOUS
  Filled 2017-01-26: qty 33

## 2017-01-26 MED ORDER — TECHNETIUM TC 99M TETROFOSMIN IV KIT
10.1000 | PACK | Freq: Once | INTRAVENOUS | Status: AC | PRN
  Administered 2017-01-26: 10.1 via INTRAVENOUS
  Filled 2017-01-26: qty 11

## 2017-02-08 ENCOUNTER — Encounter: Payer: Self-pay | Admitting: Cardiovascular Disease

## 2017-02-08 NOTE — Telephone Encounter (Signed)
SPOKE TO PATIENT . SHE STATES SHE WAS ABLE TO GET ANYONE ON THE PHONE TREE  RESCHEDULE  APPOINTMENT FOR Mar 15 2017  WILL DISCUSS WITH PRACTICE MANAGER

## 2017-02-10 ENCOUNTER — Other Ambulatory Visit: Payer: Self-pay

## 2017-02-10 ENCOUNTER — Ambulatory Visit (HOSPITAL_COMMUNITY): Payer: Medicare Other | Attending: Cardiology

## 2017-02-10 DIAGNOSIS — I359 Nonrheumatic aortic valve disorder, unspecified: Secondary | ICD-10-CM | POA: Diagnosis not present

## 2017-02-10 DIAGNOSIS — I422 Other hypertrophic cardiomyopathy: Secondary | ICD-10-CM | POA: Insufficient documentation

## 2017-02-10 DIAGNOSIS — I349 Nonrheumatic mitral valve disorder, unspecified: Secondary | ICD-10-CM | POA: Diagnosis not present

## 2017-02-10 DIAGNOSIS — I501 Left ventricular failure: Secondary | ICD-10-CM | POA: Insufficient documentation

## 2017-02-10 DIAGNOSIS — R0789 Other chest pain: Secondary | ICD-10-CM | POA: Diagnosis not present

## 2017-02-10 DIAGNOSIS — R0609 Other forms of dyspnea: Secondary | ICD-10-CM

## 2017-02-10 LAB — ECHOCARDIOGRAM COMPLETE
CHL CUP MV DEC (S): 419
E decel time: 419 msec
E/e' ratio: 11.69
FS: 52 % — AB (ref 28–44)
IVS/LV PW RATIO, ED: 1.44
LA ID, A-P, ES: 32 mm
LADIAMINDEX: 1.66 cm/m2
LAVOL: 68 mL
LAVOLA4C: 72 mL
LAVOLIN: 35.2 mL/m2
LDCA: 4.52 cm2
LEFT ATRIUM END SYS DIAM: 32 mm
LV TDI E'MEDIAL: 4.17
LVEEAVG: 11.69
LVEEMED: 11.69
LVELAT: 5.81 cm/s
LVOT diameter: 24 mm
Lateral S' vel: 12.9 cm/s
MV pk E vel: 67.9 m/s
MVPKAVEL: 141 m/s
PW: 9.95 mm — AB (ref 0.6–1.1)
TDI e' lateral: 5.81

## 2017-03-03 ENCOUNTER — Ambulatory Visit: Payer: Medicare Other | Admitting: Cardiovascular Disease

## 2017-03-15 ENCOUNTER — Ambulatory Visit (INDEPENDENT_AMBULATORY_CARE_PROVIDER_SITE_OTHER): Payer: Medicare Other | Admitting: Cardiovascular Disease

## 2017-03-15 ENCOUNTER — Encounter: Payer: Self-pay | Admitting: Cardiovascular Disease

## 2017-03-15 DIAGNOSIS — R0789 Other chest pain: Secondary | ICD-10-CM | POA: Diagnosis not present

## 2017-03-15 NOTE — Progress Notes (Signed)
Robin Vaughn returns here for follow-up for outpatient test done in evaluation of atypical chest pain. Her Myoview was normal, nonischemic in low risk and her 2-D echo was normal as well. Her symptoms have since resolved. I will see her back as needed.

## 2017-03-15 NOTE — Patient Instructions (Signed)
Your physician recommends that you schedule a follow-up appointment as needed with Dr. Berry   

## 2017-03-15 NOTE — Assessment & Plan Note (Signed)
Robin Vaughn returns here for follow-up for outpatient test done in evaluation of atypical chest pain. Her Myoview was normal, nonischemic in low risk and her 2-D echo was normal as well. Her symptoms have since resolved. I will see her back as needed. 

## 2018-03-24 IMAGING — NM NM MISC PROCEDURE
9 series · 54 of 54 positions shown · non-contrast
Comparison: none

[Series 1: wbr rest · 6.40mm/px · 6 of 64 frames shown]
[frame 6/64]
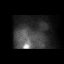
[frame 16/64]
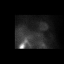
[frame 27/64]
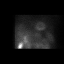
[frame 38/64]
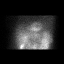
[frame 48/64]
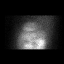
[frame 59/64]
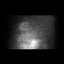

[Series 1: rest sax · 6.4mm · 6.40mm/px · 6 of 23 frames shown]
[frame 2/23]
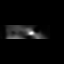
[frame 6/23]
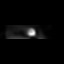
[frame 10/23]
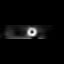
[frame 14/23]
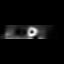
[frame 18/23]
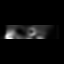
[frame 22/23]
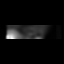

[Series 1: wbr_r-proj_st wbr rest · 6.40mm/px · 6 of 64 frames shown]
[frame 6/64]
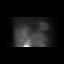
[frame 16/64]
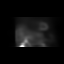
[frame 27/64]
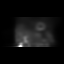
[frame 38/64]
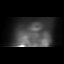
[frame 48/64]
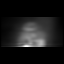
[frame 59/64]
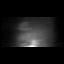

[Series 2: wbr stress-gsp · 6.40mm/px · 6 of 508 frames shown]
[frame 43/508]
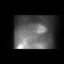
[frame 127/508]
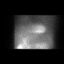
[frame 212/508]
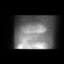
[frame 297/508]
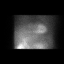
[frame 381/508]
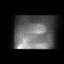
[frame 466/508]
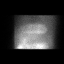

[Series 2: wbr_s-proj_st wbr stress-gsp · 6.40mm/px · 6 of 512 frames shown]
[frame 43/512]
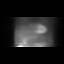
[frame 128/512]
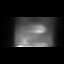
[frame 214/512]
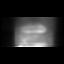
[frame 299/512]
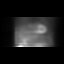
[frame 384/512]
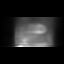
[frame 470/512]
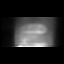

[Series 2: stress sax · 6.4mm · 6.40mm/px · 6 of 23 frames shown]
[frame 2/23]
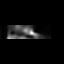
[frame 6/23]
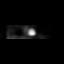
[frame 10/23]
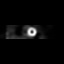
[frame 14/23]
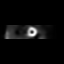
[frame 18/23]
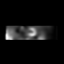
[frame 22/23]
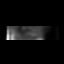

[Series 2: stress sax gs · 6.4mm · 6.40mm/px · 6 of 184 frames shown]
[frame 16/184]
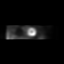
[frame 46/184]
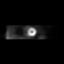
[frame 77/184]
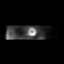
[frame 108/184]
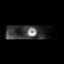
[frame 138/184]
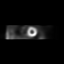
[frame 169/184]
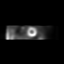

[Series 3: wbr_s-proj_st wbr stress-sum-em · 6.40mm/px · 6 of 64 frames shown]
[frame 6/64]
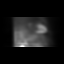
[frame 16/64]
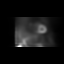
[frame 27/64]
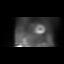
[frame 38/64]
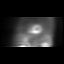
[frame 48/64]
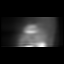
[frame 59/64]
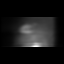

[Series 3: wbr stress-sum-em · 6.40mm/px · 6 of 64 frames shown]
[frame 6/64]
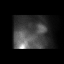
[frame 16/64]
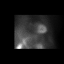
[frame 27/64]
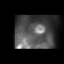
[frame 38/64]
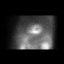
[frame 48/64]
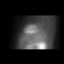
[frame 59/64]
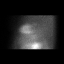

[54 of 54 positions shown; findings below may reference images not displayed]

Canned report from images found in remote index.

Refer to host system for actual result text.

## 2018-04-15 ENCOUNTER — Emergency Department (HOSPITAL_BASED_OUTPATIENT_CLINIC_OR_DEPARTMENT_OTHER)
Admission: EM | Admit: 2018-04-15 | Discharge: 2018-04-15 | Disposition: A | Discharge status: Home / Self Care | Payer: Medicare Other | Attending: Emergency Medicine | Admitting: Emergency Medicine

## 2018-04-15 ENCOUNTER — Emergency Department (HOSPITAL_BASED_OUTPATIENT_CLINIC_OR_DEPARTMENT_OTHER): Payer: Medicare Other

## 2018-04-15 ENCOUNTER — Other Ambulatory Visit: Payer: Self-pay

## 2018-04-15 ENCOUNTER — Encounter (HOSPITAL_BASED_OUTPATIENT_CLINIC_OR_DEPARTMENT_OTHER): Payer: Self-pay | Admitting: Emergency Medicine

## 2018-04-15 DIAGNOSIS — E119 Type 2 diabetes mellitus without complications: Secondary | ICD-10-CM | POA: Diagnosis not present

## 2018-04-15 DIAGNOSIS — I10 Essential (primary) hypertension: Secondary | ICD-10-CM | POA: Diagnosis not present

## 2018-04-15 DIAGNOSIS — S52592A Other fractures of lower end of left radius, initial encounter for closed fracture: Secondary | ICD-10-CM | POA: Diagnosis not present

## 2018-04-15 DIAGNOSIS — Z7984 Long term (current) use of oral hypoglycemic drugs: Secondary | ICD-10-CM | POA: Insufficient documentation

## 2018-04-15 DIAGNOSIS — Z96642 Presence of left artificial hip joint: Secondary | ICD-10-CM | POA: Diagnosis not present

## 2018-04-15 DIAGNOSIS — S0031XA Abrasion of nose, initial encounter: Secondary | ICD-10-CM | POA: Insufficient documentation

## 2018-04-15 DIAGNOSIS — Z7982 Long term (current) use of aspirin: Secondary | ICD-10-CM | POA: Diagnosis not present

## 2018-04-15 DIAGNOSIS — W010XXA Fall on same level from slipping, tripping and stumbling without subsequent striking against object, initial encounter: Secondary | ICD-10-CM | POA: Diagnosis not present

## 2018-04-15 DIAGNOSIS — S52612A Displaced fracture of left ulna styloid process, initial encounter for closed fracture: Secondary | ICD-10-CM | POA: Insufficient documentation

## 2018-04-15 DIAGNOSIS — Y999 Unspecified external cause status: Secondary | ICD-10-CM | POA: Insufficient documentation

## 2018-04-15 DIAGNOSIS — Y9301 Activity, walking, marching and hiking: Secondary | ICD-10-CM | POA: Diagnosis not present

## 2018-04-15 DIAGNOSIS — S52502A Unspecified fracture of the lower end of left radius, initial encounter for closed fracture: Secondary | ICD-10-CM

## 2018-04-15 DIAGNOSIS — Y92410 Unspecified street and highway as the place of occurrence of the external cause: Secondary | ICD-10-CM | POA: Diagnosis not present

## 2018-04-15 DIAGNOSIS — S59912A Unspecified injury of left forearm, initial encounter: Secondary | ICD-10-CM | POA: Diagnosis present

## 2018-04-15 NOTE — ED Notes (Signed)
ED Provider at bedside. 

## 2018-04-15 NOTE — ED Triage Notes (Signed)
Pt reports falling while participating in a walk yesterday. Pt has swelling to L wrist. Pt also has abrasions noted to knees, nose, and R elbow. Pt states "I only want my wrist examined". Swelling noted.

## 2018-04-15 NOTE — ED Provider Notes (Signed)
[facility] EMERGENCY DEPARTMENT Provider Note   CSN: [id number] Arrival date & time: 04/15/18  1512     History   Chief Complaint Chief Complaint  Patient presents with  . Fall    HPI Robin Vaughn is a 77 y.o. female.  Patient is a 77 year old female who presents with wrist pain after a fall.  She was in a walk yesterday and tripped on an edge of the road, falling forward.  She fell on her left arm with her wrist bent.  She complains of ongoing pain to her left wrist since that injury.  She also scraped her nose and has some abrasions to her extremities but she said everything else is fine and she does not want anything evaluated other than her wrist.  She states she did not hit her head.  There is no loss of consciousness.  She denies any neck or back pain.  She is been using Tylenol with some improvement in symptoms.  She states her tetanus shot is up-to-date.     Past Medical History:  Diagnosis Date  . Arthritis   . Atypical chest pain    Nuc/GXT 05/02/2006--Post Ejection Fraction 83%---rare PVC's with stress  . Bronchitis   . Complication of anesthesia    Pt BP dropped during hernia surgery at age 27. Pt stated "I almost died"  . Diabetes mellitus without complication (HCC)    Type 2  . Diarrhea    D/t metformin  . Exertional dyspnea   . Family history of adverse reaction to anesthesia    Pts cousin was given anesthesia and he could not speak after anesthesia, although he was conscious  . Frequency of urination   . GERD (gastroesophageal reflux disease)   . History of nephritis   . Hypertension   . IBS (irritable bowel syndrome)    Hx of  . Pneumonia     Patient Active Problem List   Diagnosis Date Noted  . Essential hypertension 01/20/2017  . Dyspnea on exertion 01/20/2017  . Atypical chest pain 01/20/2017  . Osteoarthritis of left hip 09/01/2015  . Status post total replacement of left hip 09/01/2015    Past Surgical History:  Procedure  Laterality Date  . CARPAL TUNNEL RELEASE Bilateral   . CATARACT EXTRACTION Bilateral   . COLONOSCOPY W/ POLYPECTOMY    . HERNIA REPAIR    . NASAL SINUS SURGERY    . SHOULDER SURGERY Left   . TOTAL HIP ARTHROPLASTY Left 09/01/2015   Procedure: LEFT TOTAL HIP ARTHROPLASTY ANTERIOR APPROACH;  Surgeon: [name], MD;  Location: [facility] OR;  Service: Orthopedics;  Laterality: Left;  NEEDS RNFA     OB History   None      Home Medications    Prior to Admission medications   Medication Sig Start Date End Date Taking? Authorizing Provider  amLODipine-benazepril (LOTREL) 5-10 MG per capsule TAKE ONE CAPSULE BY MOUTH ONCE DAILY 10/11/13   Runell Gess, MD  aspirin EC 325 MG EC tablet Take 1 tablet (325 mg total) by mouth 2 (two) times daily after a meal. 09/03/15   [name], MD  Cholecalciferol (VITAMIN D-3 PO) Take 1 capsule by mouth 2 (two) times a week.    [provider]  Coenzyme Q10 (CO Q-10 PO) Take 200 mg by mouth 3 (three) times a week.     [provider]  CRANBERRY PO Take 1 capsule by mouth 3 (three) times a week.    [provider]  metFORMIN (GLUCOPHAGE) 500 MG tablet Take 500 mg by mouth daily.    [provider]  Misc Natural Products (LEG VEIN & CIRCULATION PO) Take 1 tablet by mouth daily.    [provider]  Multiple Vitamin (ESSENTIAL ONE DAILY PO) Take 1 tablet by mouth 3 (three) times a week.    [provider]  OVER THE COUNTER MEDICATION Take 2 capsules by mouth daily. "Basis Nicotinamide Riboside"    [provider]  Probiotic Product (PROBIOTIC PO) Take 1 capsule by mouth daily.    [provider]  Resveratrol 100 MG CAPS Take 100 mg by mouth daily.    [provider]  TURMERIC PO Take 1 capsule by mouth daily.    [provider]    Family History Family History  Problem Relation Age of Onset  . Diabetes Mother        Borderline in 72's  . Lung  cancer Father     Social History Social History   Tobacco Use  . Smoking status: Never Smoker  . Smokeless tobacco: Never Used  Substance Use Topics  . Alcohol use: No  . Drug use: No     Allergies   Penicillins cross reactors   Review of Systems Review of Systems  Constitutional: Negative for fever.  Gastrointestinal: Negative for nausea and vomiting.  Musculoskeletal: Positive for arthralgias and joint swelling. Negative for back pain and neck pain.  Skin: Negative for wound.  Neurological: Negative for weakness, numbness and headaches.     Physical Exam Updated Vital Signs BP (!) 146/69 (BP Location: Left Arm)   Pulse 95   Temp 98.3 F (36.8 C) (Oral)   Resp 18   Ht 5' 2.5" (1.588 m)   Wt 86.2 kg (190 lb)   SpO2 100%   BMI 34.20 kg/m   Physical Exam  Constitutional: She is oriented to person, place, and time. She appears well-developed and well-nourished.  HENT:  Head: Normocephalic and atraumatic.  Neck: Normal range of motion. Neck supple.  Cardiovascular: Normal rate.  Pulmonary/Chest: Effort normal.  Musculoskeletal: She exhibits edema and tenderness.  Patient has diffuse swelling over the left wrist and the dorsum of the left hand.  There is some ecchymosis over the wrist and hand.  There is tenderness mostly over the left wrist.  There is some minor tenderness to the mid hand.  She has some limited range of motion due to swelling but she is able flex and extend the fingers including the thumb.  She has normal sensation to light touch in all nerve distributions of the hand.  Radial pulses are intact.  Is no pain to the elbow or remainder of the forearm.  Neurological: She is alert and oriented to person, place, and time.  Skin: Skin is warm and dry.  Psychiatric: She has a normal mood and affect.     ED Treatments / Results  Labs (all labs ordered are listed, but only abnormal results are displayed) Labs Reviewed - No data to  display  EKG None  Radiology Dg Wrist Complete Left  Result Date: 04/15/2018 CLINICAL DATA:  Recent fall with wrist pain, initial encounter EXAM: LEFT WRIST - COMPLETE 3+ VIEW COMPARISON:  None. FINDINGS: There is a comminuted distal radial fracture which extends into the radiocarpal joint with some impaction and anterior angulation at the fracture site. Minimally displaced ulnar styloid fracture is noted as well. Degenerative changes at the first [facility] joint are noted. No other  focal abnormality is seen. IMPRESSION: Distal radial and ulnar fractures as described. Electronically Signed   By: Alcide Clever M.D.   On: 04/15/2018 15:47    Procedures Procedures (including critical care time)  Medications Ordered in ED Medications - No data to display   Initial Impression / Assessment and Plan / ED Course  I have reviewed the triage vital signs and the nursing notes.  Pertinent labs & imaging results that were available during my care of the patient were reviewed by me and considered in my medical decision making (see chart for details).     Patient with a distal radius fracture and ulnar styloid fracture.  There is no wounds overlying the area.  She was placed in a sugar tong splint and given a sling.  She was advised in ice and elevation.  She will use ibuprofen and Tylenol at home for symptomatically.  She denies any for any further pain medication.  She wants to follow-up with her orthopedist at [facility] orthopedics.  I did give her the name of a hand surgeon in case they were not comfortable seeing her at [facility] orthopedics although I felt that this was appropriate.  Final Clinical Impressions(s) / ED Diagnoses   Final diagnoses:  Closed fracture of distal end of left radius, unspecified fracture morphology, initial encounter  Closed displaced fracture of styloid process of left ulna, initial encounter    ED Discharge Orders    None       Rolan Bucco, MD 04/15/18 1635

## 2018-04-18 ENCOUNTER — Encounter (INDEPENDENT_AMBULATORY_CARE_PROVIDER_SITE_OTHER): Payer: Self-pay | Admitting: Orthopaedic Surgery

## 2018-04-18 ENCOUNTER — Ambulatory Visit (INDEPENDENT_AMBULATORY_CARE_PROVIDER_SITE_OTHER): Payer: Medicare Other | Admitting: Orthopaedic Surgery

## 2018-04-18 DIAGNOSIS — S52502A Unspecified fracture of the lower end of left radius, initial encounter for closed fracture: Secondary | ICD-10-CM

## 2018-04-18 NOTE — Progress Notes (Signed)
Office Visit Note   Patient: Robin Vaughn           Date of Birth: [birth year]           MRN: [id number] Visit Date: 04/18/2018              Requested by: Creola Corn, MD [address] [place], [place] [address] PCP: Creola Corn, MD   Assessment & Plan: Visit Diagnoses:  1. Closed fracture of distal end of left radius, unspecified fracture morphology, initial encounter     Plan: Given the her left wrist fracture is an intra-articular distal radius fracture and there is slight displacement, we are recommending surgery because this will continue to displace.  However she does have significant swelling and she needs strict elevation and ice.  We will work on getting surgery scheduled for later date.  I showed her a wrist model explained in detail what surgery involves.  I discussed the risk minutes of the surgery as well.  For now we will put her in a Velcro wrist splint so she can continue to work on getting her wrist feeling better from an eye standpoint and decreasing her swelling with limited use of the wrist until the time of surgery.  All questions concerns were answered and addressed.  We see her back in 2 weeks postoperatively this will be for suture removal in a repeat 2 views of her left distal radius.  Follow-Up Instructions: Return for 2 weeks post-op.   Orders:  No orders of the defined types were placed in this encounter.  No orders of the defined types were placed in this encounter.     Procedures: No procedures performed   Clinical Data: No additional findings.   Subjective: No chief complaint on file. The patient comes in with chief complaint of left wrist pain.  She has a known fracture of the left wrist after having a mechanical fall this past weekend and being seen in the emergency room.  Since I have done past surgery on her hip she went to follow-up with me.  She does report significant left hand pain and swelling.  She is in a splint today but splint is  been removed.  She does report some numbness in her hand in the median nerve distribution is where she points but it is not unbearable she states.  She is right-hand dominant.  She is actually leaving town in the next day for a reunion.  I talked her about having one my partners to fix her wrist next week since I will be out of town but she would rather wait for when I am back in town from vacation.  Again we will see her back in 2 weeks follow-up we will need an AP and lateral of her left distal radius/wrist out of any type of splint.  HPI  Review of Systems He currently denies any headache, chest pain, shortness of breath, fever, chills, nausea, vomiting.  Objective: Vital Signs: There were no vitals taken for this visit.  Physical Exam She is alert and oriented x3 and in no acute distress Ortho Exam Examination of her left hand and wrist showed Byrnett bruising and swelling.  She has subjective numbness in the median nerve distribution but is not unbearable to her.  Her hand is well-perfused. Specialty Comments:  No specialty comments available.  Imaging: No results found. X-rays independently reviewed with her show a displaced intra-articular distal radius fracture and a fracture of the tip of the  ulnar styloid.  PMFS History: Patient Active Problem List   Diagnosis Date Noted  . Closed fracture of left distal radius 04/18/2018  . Essential hypertension 01/20/2017  . Dyspnea on exertion 01/20/2017  . Atypical chest pain 01/20/2017  . Osteoarthritis of left hip 09/01/2015  . Status post total replacement of left hip 09/01/2015   Past Medical History:  Diagnosis Date  . Arthritis   . Atypical chest pain    Nuc/GXT 05/02/2006--Post Ejection Fraction 83%---rare PVC's with stress  . Bronchitis   . Complication of anesthesia    Pt BP dropped during hernia surgery at age 66. Pt stated "I almost died"  . Diabetes mellitus without complication (HCC)    Type 2  . Diarrhea    D/t  metformin  . Exertional dyspnea   . Family history of adverse reaction to anesthesia    Pts cousin was given anesthesia and he could not speak after anesthesia, although he was conscious  . Frequency of urination   . GERD (gastroesophageal reflux disease)   . History of nephritis   . Hypertension   . IBS (irritable bowel syndrome)    Hx of  . Pneumonia     Family History  Problem Relation Age of Onset  . Diabetes Mother        Borderline in 35's  . Lung cancer Father     Past Surgical History:  Procedure Laterality Date  . CARPAL TUNNEL RELEASE Bilateral   . CATARACT EXTRACTION Bilateral   . COLONOSCOPY W/ POLYPECTOMY    . HERNIA REPAIR    . NASAL SINUS SURGERY    . SHOULDER SURGERY Left   . TOTAL HIP ARTHROPLASTY Left 09/01/2015   Procedure: LEFT TOTAL HIP ARTHROPLASTY ANTERIOR APPROACH;  Surgeon: [name], MD;  Location: [facility] OR;  Service: Orthopedics;  Laterality: Left;  NEEDS RNFA   Social History   Occupational History  . Not on file  Tobacco Use  . Smoking status: Never Smoker  . Smokeless tobacco: Never Used  Substance and Sexual Activity  . Alcohol use: No  . Drug use: No  . Sexual activity: Not on file

## 2018-04-20 ENCOUNTER — Telehealth (INDEPENDENT_AMBULATORY_CARE_PROVIDER_SITE_OTHER): Payer: Self-pay

## 2018-04-20 NOTE — Telephone Encounter (Signed)
See below

## 2018-04-20 NOTE — Telephone Encounter (Signed)
She will then need to be seen early next week for placing her in a cast.  Work her in with [place] next week and will need new x-rays then as well.

## 2018-04-20 NOTE — Telephone Encounter (Signed)
Patient left voice mail stating she wanted to pursue conservative treatment for fracture, like a cast, rather than having surgery.

## 2018-04-23 ENCOUNTER — Ambulatory Visit (INDEPENDENT_AMBULATORY_CARE_PROVIDER_SITE_OTHER): Payer: Medicare Other | Admitting: Physician Assistant

## 2018-04-23 NOTE — Telephone Encounter (Signed)
LMOM for patient letting her know I can put her in the schedule this morning if she can come to call back and let me know

## 2018-04-25 ENCOUNTER — Encounter (INDEPENDENT_AMBULATORY_CARE_PROVIDER_SITE_OTHER): Payer: Self-pay | Admitting: Physician Assistant

## 2018-04-25 ENCOUNTER — Ambulatory Visit (INDEPENDENT_AMBULATORY_CARE_PROVIDER_SITE_OTHER): Payer: Medicare Other

## 2018-04-25 ENCOUNTER — Ambulatory Visit (INDEPENDENT_AMBULATORY_CARE_PROVIDER_SITE_OTHER): Payer: Medicare Other | Admitting: Physician Assistant

## 2018-04-25 DIAGNOSIS — M25532 Pain in left wrist: Secondary | ICD-10-CM | POA: Diagnosis not present

## 2018-04-25 DIAGNOSIS — S52502D Unspecified fracture of the lower end of left radius, subsequent encounter for closed fracture with routine healing: Secondary | ICD-10-CM

## 2018-04-25 NOTE — Progress Notes (Signed)
Office Visit Note   Patient: Robin Vaughn           Date of Birth: 03/17/1941           MRN: [id number] Visit Date: 04/25/2018              Requested by: Creola Corn, MD [address] [place], [place] [address] PCP: Creola Corn, MD   Assessment & Plan: Visit Diagnoses:  1. Pain in left wrist   2. Closed fracture of distal end of left radius with routine healing, unspecified fracture morphology, subsequent encounter     Plan: Place her in a short arm cast.  He has his well-padded Xeroform was placed over the palm of the hand where she had a rash.  We will see her back in 3 weeks remove the cast and obtain AP lateral oblique views of the wrist.  I did discuss with her due to the intra-articular involvement and displacement that she most likely will end up with arthritic wrist she understands this and does not want any type of surgery. Recommended elevation wiggling the fingers.  She will work on gentle range of motion of the elbow. Follow-Up Instructions: Return in about 3 weeks (around 05/16/2018).   Orders:  Orders Placed This Encounter  Procedures  . XR Wrist Complete Left   No orders of the defined types were placed in this encounter.     Procedures: No procedures performed   Clinical Data: No additional findings.   Subjective: Chief Complaint  Patient presents with  . Left Wrist - Fracture    HPI Robin Vaughn returns today for follow-up of her left wrist.  She canceled her open reduction internal fixation of her left wrist fracture due to the fact the she has read many reviews online and most of those folks have bad outcomes in general with surgical fixation.  Therefore she wants to pursue conservative treatment for her left wrist fracture.  She sustained left wrist fracture on 04/14/2018.  She is been on a Velcro wrist splint.  She notes that she has had some itching of the palm left hand has been scratching this. Review of Systems Denies any fevers  chills.  Objective: Vital Signs: There were no vitals taken for this visit.  Physical Exam  Constitutional: She appears well-developed and well-nourished. No distress.  Skin: She is not diaphoretic.    Ortho Exam Left elbow good range of motion without significant pain.  Left forearms supple.  Radial pulses intact.  She is able to supinate pronate the forearm.  No gross deformity of the wrist.  Sensation grossly intact throughout the hand.  She does have a rash in the palm of the hand consistent with excoriation. Specialty Comments:  No specialty comments available.  Imaging: Xr Wrist Complete Left  Result Date: 04/25/2018 3 views left wrist: Comminuted left distal radius fracture with intra-articular involvement and displacement.  Fracture of the distal styloid again seen.  No change in overall position alignment.    PMFS History: Patient Active Problem List   Diagnosis Date Noted  . Closed fracture of left distal radius 04/18/2018  . Essential hypertension 01/20/2017  . Dyspnea on exertion 01/20/2017  . Atypical chest pain 01/20/2017  . Osteoarthritis of left hip 09/01/2015  . Status post total replacement of left hip 09/01/2015   Past Medical History:  Diagnosis Date  . Arthritis   . Atypical chest pain    Nuc/GXT 05/02/2006--Post Ejection Fraction 83%---rare PVC's with stress  .  Bronchitis   . Complication of anesthesia    Pt BP dropped during hernia surgery at age 28. Pt stated "I almost died"  . Diabetes mellitus without complication (HCC)    Type 2  . Diarrhea    D/t metformin  . Exertional dyspnea   . Family history of adverse reaction to anesthesia    Pts cousin was given anesthesia and he could not speak after anesthesia, although he was conscious  . Frequency of urination   . GERD (gastroesophageal reflux disease)   . History of nephritis   . Hypertension   . IBS (irritable bowel syndrome)    Hx of  . Pneumonia     Family History  Problem Relation Age  of Onset  . Diabetes Mother        Borderline in 60's  . Lung cancer Father     Past Surgical History:  Procedure Laterality Date  . CARPAL TUNNEL RELEASE Bilateral   . CATARACT EXTRACTION Bilateral   . COLONOSCOPY W/ POLYPECTOMY    . HERNIA REPAIR    . NASAL SINUS SURGERY    . SHOULDER SURGERY Left   . TOTAL HIP ARTHROPLASTY Left 09/01/2015   Procedure: LEFT TOTAL HIP ARTHROPLASTY ANTERIOR APPROACH;  Surgeon: [name], MD;  Location: [facility] OR;  Service: Orthopedics;  Laterality: Left;  NEEDS RNFA   Social History   Occupational History  . Not on file  Tobacco Use  . Smoking status: Never Smoker  . Smokeless tobacco: Never Used  Substance and Sexual Activity  . Alcohol use: No  . Drug use: No  . Sexual activity: Not on file

## 2018-05-01 ENCOUNTER — Ambulatory Visit: Admit: 2018-05-01 | Payer: Medicare Other | Admitting: Orthopaedic Surgery

## 2018-05-01 SURGERY — OPEN REDUCTION INTERNAL FIXATION (ORIF) DISTAL RADIUS FRACTURE
Anesthesia: Choice | Site: Wrist | Laterality: Left

## 2018-05-14 ENCOUNTER — Ambulatory Visit (INDEPENDENT_AMBULATORY_CARE_PROVIDER_SITE_OTHER): Payer: Medicare Other | Admitting: Physician Assistant

## 2018-05-14 ENCOUNTER — Inpatient Hospital Stay (INDEPENDENT_AMBULATORY_CARE_PROVIDER_SITE_OTHER): Payer: Medicare Other | Admitting: Orthopaedic Surgery

## 2018-05-30 ENCOUNTER — Ambulatory Visit (INDEPENDENT_AMBULATORY_CARE_PROVIDER_SITE_OTHER): Payer: Medicare Other | Admitting: Physician Assistant

## 2018-05-30 ENCOUNTER — Ambulatory Visit (INDEPENDENT_AMBULATORY_CARE_PROVIDER_SITE_OTHER): Payer: Medicare Other

## 2018-05-30 ENCOUNTER — Encounter (INDEPENDENT_AMBULATORY_CARE_PROVIDER_SITE_OTHER): Payer: Self-pay | Admitting: Physician Assistant

## 2018-05-30 DIAGNOSIS — M25532 Pain in left wrist: Secondary | ICD-10-CM | POA: Diagnosis not present

## 2018-05-30 DIAGNOSIS — S52502D Unspecified fracture of the lower end of left radius, subsequent encounter for closed fracture with routine healing: Secondary | ICD-10-CM

## 2018-05-30 NOTE — Progress Notes (Signed)
Office Visit Note   Patient: Robin Vaughn           Date of Birth: Mar 23, 1941           MRN: [id number] Visit Date: 05/30/2018              Requested by: Robin Corn, MD [address] [place], [place] [address] PCP: Robin Corn, MD   Assessment & Plan: Visit Diagnoses:  1. Pain in left wrist   2. Closed fracture of distal end of left radius with routine healing, unspecified fracture morphology, subsequent encounter     Plan: We will have her follow-up with [place] in 1 month we will not perform any films at that time just like to see her back one final time and then check her overall motion.  She is pain in the wrist splint that she was given initially in the ER for the next 3 weeks and treated like a cast.  Then after that she can come out of the begin gentle range of motion of the wrist.  She is work on range of motion the fingers in the interim.  Questions were encouraged and answered.  Follow-Up Instructions: Return in about 1 month (around 06/27/2018).   Orders:  Orders Placed This Encounter  Procedures  . XR Wrist 2 Views Left   No orders of the defined types were placed in this encounter.     Procedures: No procedures performed   Clinical Data: No additional findings.   Subjective: No chief complaint on file.   HPI Robin Vaughn returns today just over 6 weeks status post left wrist fracture.  She is chosen to treat this  conservatively.  She is been in a short arm cast.  She is overall doing well.  She does note some swelling about the hand. Review of Systems   Objective: Vital Signs: There were no vitals taken for this visit.  Physical Exam  Ortho Exam Left wrist shows swelling about the wrist hand and fingers.  Sensation grossly intact throughout the left hand to light touch.  Radial pulse intact.  She has tenderness about the distal radius.  No rashes skin lesions ulcerations or impending ulcers. Specialty Comments:  No specialty comments  available.  Imaging: Xr Wrist 2 Views Left  Result Date: 05/30/2018 Left wrist AP and lateral views: Interval healing.  No change in overall position.  Anterior angulation of the distal radius persist.    PMFS History: Patient Active Problem List   Diagnosis Date Noted  . Closed fracture of left distal radius 04/18/2018  . Essential hypertension 01/20/2017  . Dyspnea on exertion 01/20/2017  . Atypical chest pain 01/20/2017  . Osteoarthritis of left hip 09/01/2015  . Status post total replacement of left hip 09/01/2015   Past Medical History:  Diagnosis Date  . Arthritis   . Atypical chest pain    Nuc/GXT 05/02/2006--Post Ejection Fraction 83%---rare PVC's with stress  . Bronchitis   . Complication of anesthesia    Pt BP dropped during hernia surgery at age 79. Pt stated "I almost died"  . Diabetes mellitus without complication (HCC)    Type 2  . Diarrhea    D/t metformin  . Exertional dyspnea   . Family history of adverse reaction to anesthesia    Pts cousin was given anesthesia and he could not speak after anesthesia, although he was conscious  . Frequency of urination   . GERD (gastroesophageal reflux disease)   .  History of nephritis   . Hypertension   . IBS (irritable bowel syndrome)    Hx of  . Pneumonia     Family History  Problem Relation Age of Onset  . Diabetes Mother        Borderline in 30's  . Lung cancer Father     Past Surgical History:  Procedure Laterality Date  . CARPAL TUNNEL RELEASE Bilateral   . CATARACT EXTRACTION Bilateral   . COLONOSCOPY W/ POLYPECTOMY    . HERNIA REPAIR    . NASAL SINUS SURGERY    . SHOULDER SURGERY Left   . TOTAL HIP ARTHROPLASTY Left 09/01/2015   Procedure: LEFT TOTAL HIP ARTHROPLASTY ANTERIOR APPROACH;  Surgeon: Robin Hitch, MD;  Location: [facility] OR;  Service: Orthopedics;  Laterality: Left;  NEEDS RNFA   Social History   Occupational History  . Not on file  Tobacco Use  . Smoking status: Never Smoker    . Smokeless tobacco: Never Used  Substance and Sexual Activity  . Alcohol use: No  . Drug use: No  . Sexual activity: Not on file

## 2018-06-28 ENCOUNTER — Ambulatory Visit (INDEPENDENT_AMBULATORY_CARE_PROVIDER_SITE_OTHER): Payer: Medicare Other

## 2018-06-28 ENCOUNTER — Encounter (INDEPENDENT_AMBULATORY_CARE_PROVIDER_SITE_OTHER): Payer: Self-pay | Admitting: Physician Assistant

## 2018-06-28 ENCOUNTER — Ambulatory Visit (INDEPENDENT_AMBULATORY_CARE_PROVIDER_SITE_OTHER): Payer: Medicare Other | Admitting: Physician Assistant

## 2018-06-28 DIAGNOSIS — S52502D Unspecified fracture of the lower end of left radius, subsequent encounter for closed fracture with routine healing: Secondary | ICD-10-CM

## 2018-06-28 NOTE — Progress Notes (Signed)
Office Visit Note   Patient: Robin Vaughn           Date of Birth: Mar 21, 1941           MRN: [id number] Visit Date: 06/28/2018              Requested by: Creola Corn, MD [address] [place], [place] [address] PCP: Creola Corn, MD   Assessment & Plan: Visit Diagnoses:  1. Closed fracture of distal end of left radius with routine healing, unspecified fracture morphology, subsequent encounter     Plan: She will work on range of motion particularly supination.  Did talk to her about the fact that she probably will develop some arthritic changes of the wrist.  She will follow-up with [place] on as-needed basis.  Follow-Up Instructions: Return if symptoms worsen or fail to improve.   Orders:  Orders Placed This Encounter  Procedures  . XR Wrist Complete Left   No orders of the defined types were placed in this encounter.     Procedures: No procedures performed   Clinical Data: No additional findings.   Subjective: Chief Complaint  Patient presents with  . Left Wrist - Fracture, Follow-up    HPI Robin Vaughn returns now approximately 10 weeks status post left wrist fracture.  She is chosen to treat this conservatively.  She comes in today stating that she has some pain and tenderness and some swelling about the wrist but overall is trending towards improvement.  She has been back to playing the piano. Review of Systems   Objective: Vital Signs: There were no vitals taken for this visit.  Physical Exam  Ortho Exam Left wrist slight swelling dorsal aspect of the wrist.  Sensation intact throughout the hand.  Radial pulse intact.  No rashes skin lesions ulcerations.  She has near full supination compared to the right side and has full pronation.  Good dorsiflexion volar flexion of the wrist without significant pain.  Nontender with palpation over the distal radius. Specialty Comments:  No specialty comments available.  Imaging: Xr Wrist Complete Left  Result Date:  06/28/2018 Left wrist 3 views: Left wrist fracture is well-healed.  Still anterior angulation of the radius persist.  No other fractures identified.  Significant CMC joint arthritis of the thumb.    PMFS History: Patient Active Problem List   Diagnosis Date Noted  . Closed fracture of left distal radius 04/18/2018  . Essential hypertension 01/20/2017  . Dyspnea on exertion 01/20/2017  . Atypical chest pain 01/20/2017  . Osteoarthritis of left hip 09/01/2015  . Status post total replacement of left hip 09/01/2015   Past Medical History:  Diagnosis Date  . Arthritis   . Atypical chest pain    Nuc/GXT 05/02/2006--Post Ejection Fraction 83%---rare PVC's with stress  . Bronchitis   . Complication of anesthesia    Pt BP dropped during hernia surgery at age 77. Pt stated "I almost died"  . Diabetes mellitus without complication (HCC)    Type 2  . Diarrhea    D/t metformin  . Exertional dyspnea   . Family history of adverse reaction to anesthesia    Pts cousin was given anesthesia and he could not speak after anesthesia, although he was conscious  . Frequency of urination   . GERD (gastroesophageal reflux disease)   . History of nephritis   . Hypertension   . IBS (irritable bowel syndrome)    Hx of  . Pneumonia     Family History  Problem Relation Age of Onset  . Diabetes Mother        Borderline in 34's  . Lung cancer Father     Past Surgical History:  Procedure Laterality Date  . CARPAL TUNNEL RELEASE Bilateral   . CATARACT EXTRACTION Bilateral   . COLONOSCOPY W/ POLYPECTOMY    . HERNIA REPAIR    . NASAL SINUS SURGERY    . SHOULDER SURGERY Left   . TOTAL HIP ARTHROPLASTY Left 09/01/2015   Procedure: LEFT TOTAL HIP ARTHROPLASTY ANTERIOR APPROACH;  Surgeon: [name], MD;  Location: [facility] OR;  Service: Orthopedics;  Laterality: Left;  NEEDS RNFA   Social History   Occupational History  . Not on file  Tobacco Use  . Smoking status: Never Smoker  . Smokeless  tobacco: Never Used  Substance and Sexual Activity  . Alcohol use: No  . Drug use: No  . Sexual activity: Not on file

## 2018-07-29 ENCOUNTER — Emergency Department (HOSPITAL_BASED_OUTPATIENT_CLINIC_OR_DEPARTMENT_OTHER)
Admission: EM | Admit: 2018-07-29 | Discharge: 2018-07-29 | Disposition: A | Discharge status: Home / Self Care | Payer: Medicare Other | Attending: Emergency Medicine | Admitting: Emergency Medicine

## 2018-07-29 ENCOUNTER — Other Ambulatory Visit: Payer: Self-pay

## 2018-07-29 ENCOUNTER — Encounter (HOSPITAL_BASED_OUTPATIENT_CLINIC_OR_DEPARTMENT_OTHER): Payer: Self-pay

## 2018-07-29 DIAGNOSIS — Z5321 Procedure and treatment not carried out due to patient leaving prior to being seen by health care provider: Secondary | ICD-10-CM | POA: Insufficient documentation

## 2018-07-29 DIAGNOSIS — G501 Atypical facial pain: Secondary | ICD-10-CM | POA: Diagnosis present

## 2018-07-29 NOTE — ED Notes (Signed)
Pt alerted staff that she was getting sleepy and was going to go home. Pt advised that we had 4 discharges coming up and she will likely have a room. Pt states she will just go home. Pt advised to return to the ED if she gets worse or changes her mind.

## 2018-07-29 NOTE — ED Triage Notes (Signed)
Pt has a lesion on L side of the face that is open and draining.

## 2019-06-11 IMAGING — DX DG WRIST COMPLETE 3+V*L*
3 series · 3 of 3 positions shown · non-contrast
Comparison: None.

CLINICAL DATA: Recent fall with wrist pain, initial encounter

EXAM:
LEFT WRIST - COMPLETE 3+ VIEW

[wrist pa]
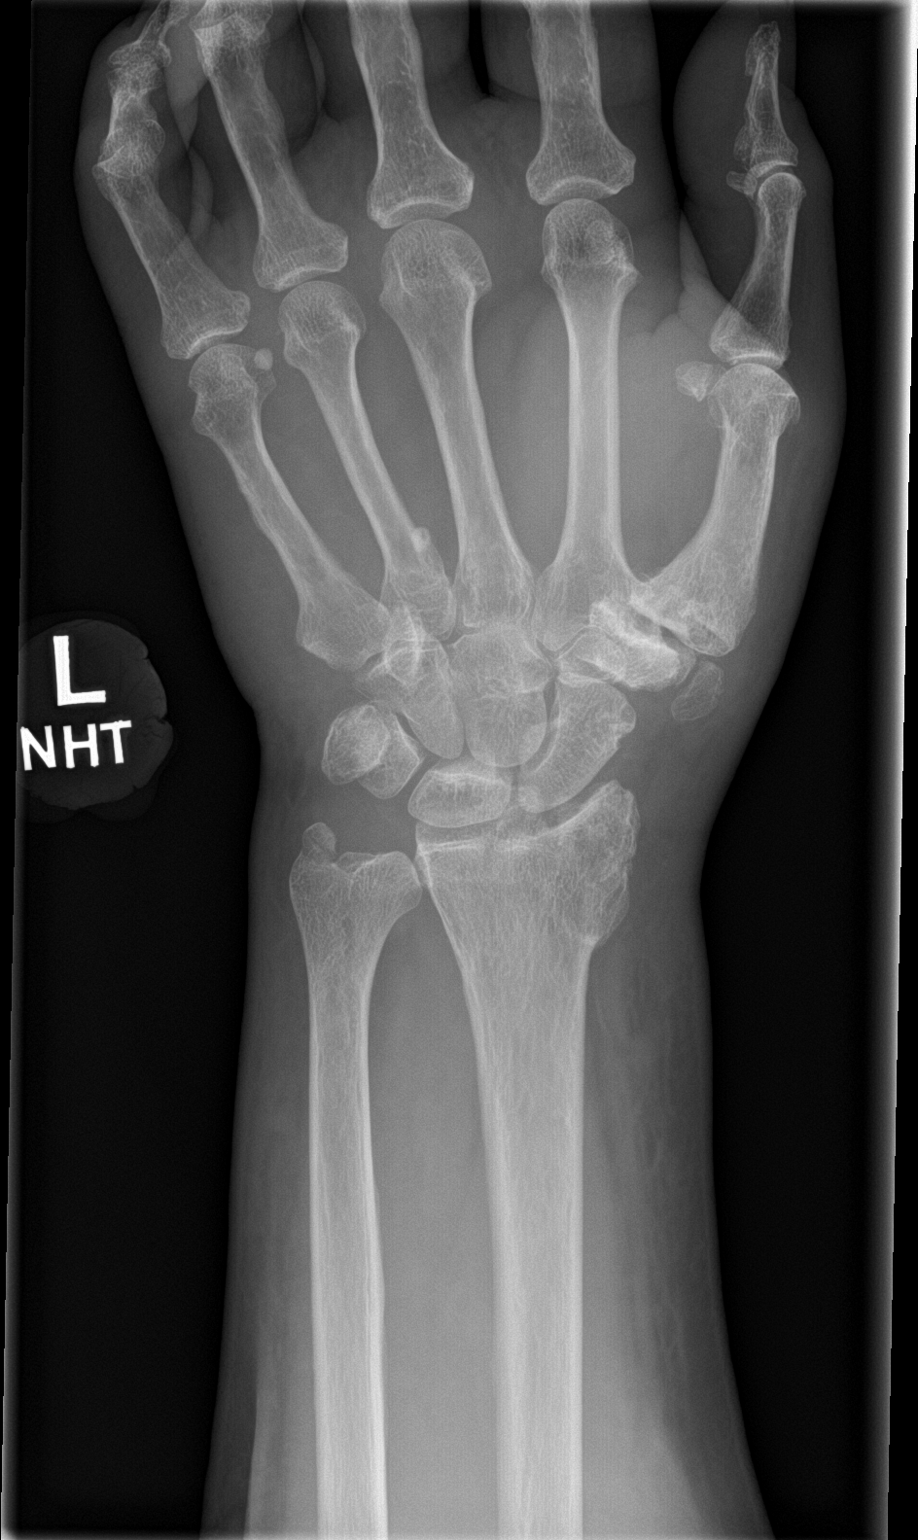

[wrist obl]
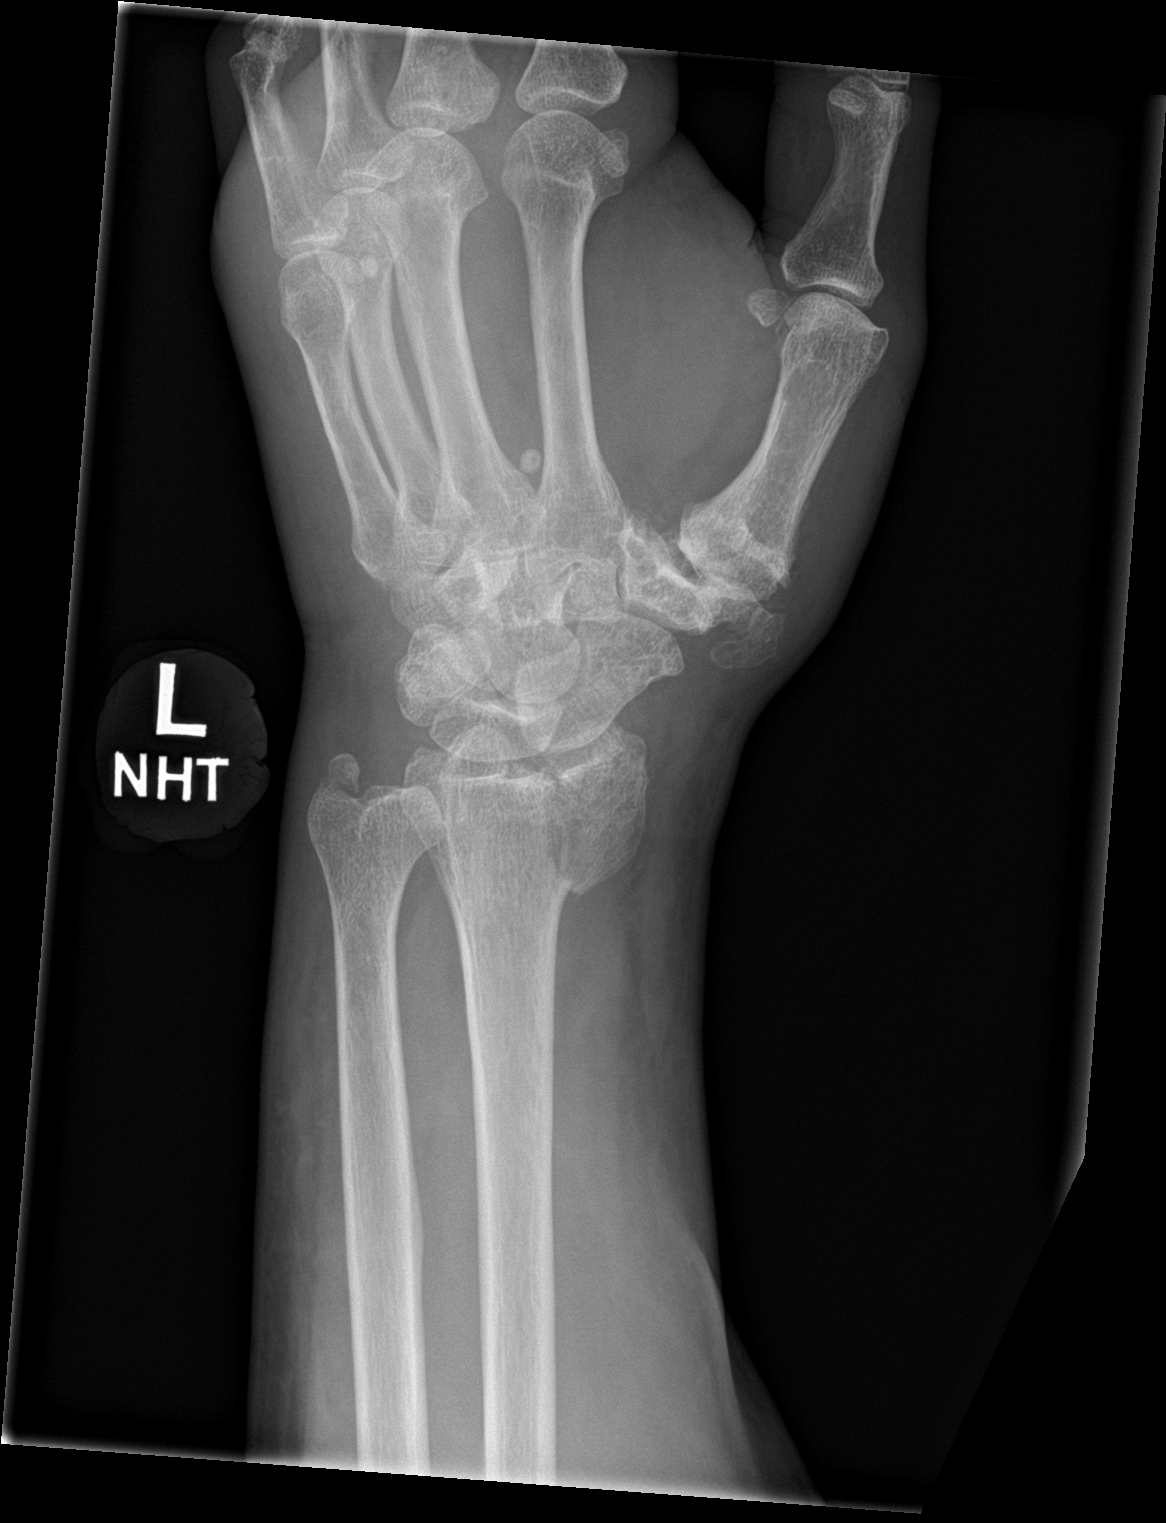

[wrist lat]
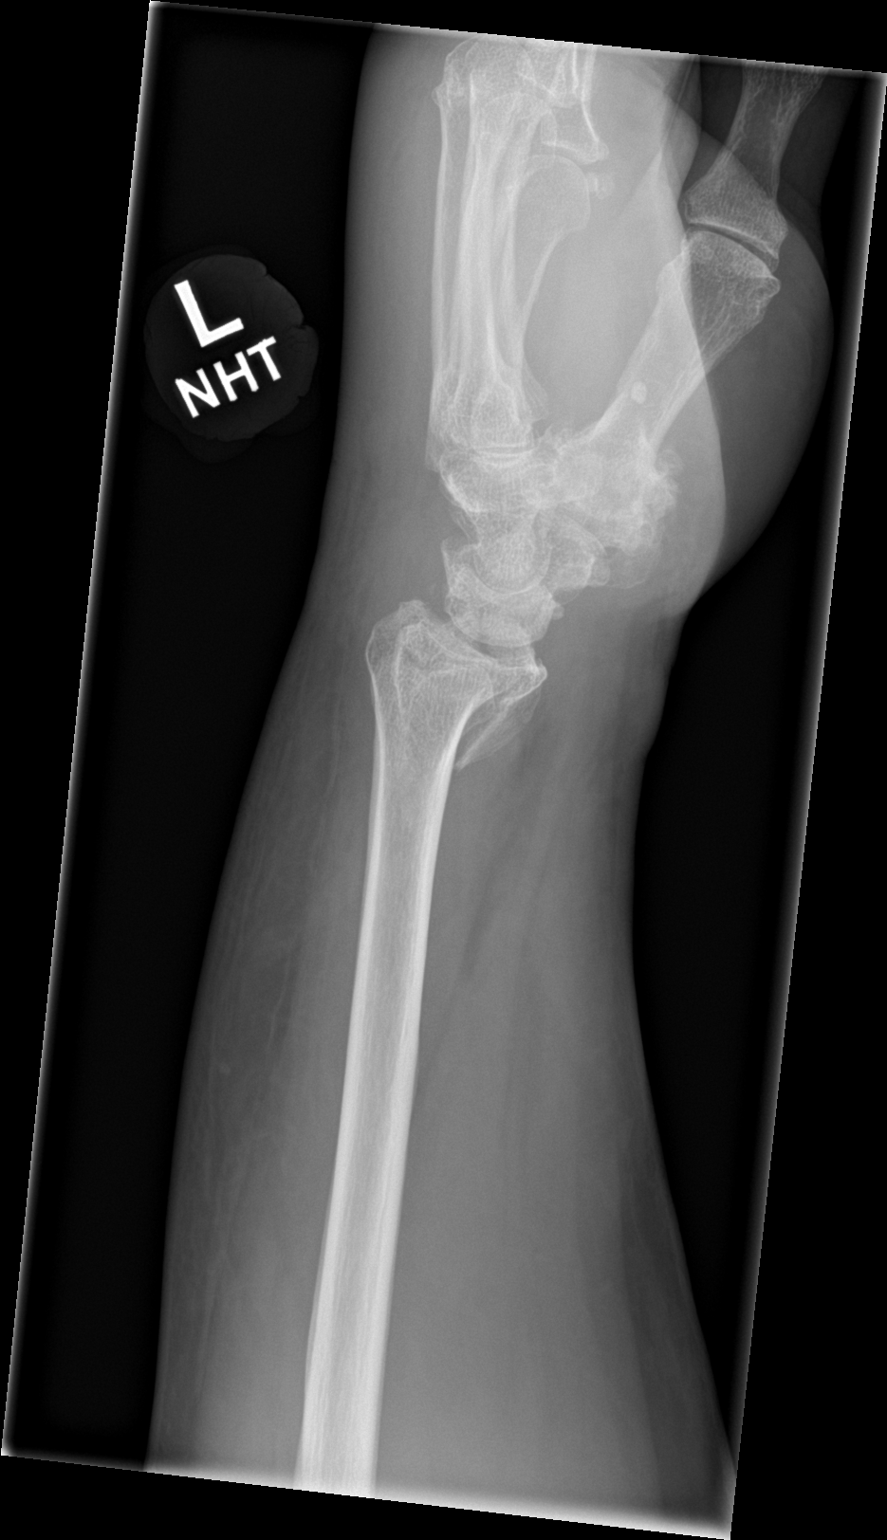

[3 of 3 positions shown; findings below may reference images not displayed]

FINDINGS: There is a comminuted distal radial fracture which extends into the
radiocarpal joint with some impaction and anterior angulation at the
fracture site. Minimally displaced ulnar styloid fracture is noted
as well. Degenerative changes at the first CMC joint are noted. No
other focal abnormality is seen.
IMPRESSION: Distal radial and ulnar fractures as described.

## 2020-09-05 DIAGNOSIS — Z23 Encounter for immunization: Secondary | ICD-10-CM | POA: Diagnosis not present

## 2020-09-16 DIAGNOSIS — N182 Chronic kidney disease, stage 2 (mild): Secondary | ICD-10-CM | POA: Diagnosis not present

## 2020-09-16 DIAGNOSIS — E1165 Type 2 diabetes mellitus with hyperglycemia: Secondary | ICD-10-CM | POA: Diagnosis not present

## 2020-09-16 DIAGNOSIS — Z794 Long term (current) use of insulin: Secondary | ICD-10-CM | POA: Diagnosis not present

## 2020-09-16 DIAGNOSIS — I10 Essential (primary) hypertension: Secondary | ICD-10-CM | POA: Diagnosis not present

## 2020-11-23 ENCOUNTER — Emergency Department (HOSPITAL_BASED_OUTPATIENT_CLINIC_OR_DEPARTMENT_OTHER)
Admission: EM | Admit: 2020-11-23 | Discharge: 2020-11-23 | Disposition: A | Discharge status: Home / Self Care | Payer: Medicare PPO | Attending: Emergency Medicine | Admitting: Emergency Medicine

## 2020-11-23 ENCOUNTER — Other Ambulatory Visit: Payer: Self-pay

## 2020-11-23 ENCOUNTER — Encounter (HOSPITAL_BASED_OUTPATIENT_CLINIC_OR_DEPARTMENT_OTHER): Payer: Self-pay | Admitting: Emergency Medicine

## 2020-11-23 DIAGNOSIS — Z5321 Procedure and treatment not carried out due to patient leaving prior to being seen by health care provider: Secondary | ICD-10-CM | POA: Insufficient documentation

## 2020-11-23 DIAGNOSIS — R079 Chest pain, unspecified: Secondary | ICD-10-CM | POA: Diagnosis not present

## 2020-11-23 LAB — CBC
HCT: 40.7 % (ref 36.0–46.0)
Hemoglobin: 13.2 g/dL (ref 12.0–15.0)
MCH: 28.2 pg (ref 26.0–34.0)
MCHC: 32.4 g/dL (ref 30.0–36.0)
MCV: 87 fL (ref 80.0–100.0)
Platelets: 188 10*3/uL (ref 150–400)
RBC: 4.68 MIL/uL (ref 3.87–5.11)
RDW: 13.8 % (ref 11.5–15.5)
WBC: 7.9 10*3/uL (ref 4.0–10.5)
nRBC: 0 % (ref 0.0–0.2)

## 2020-11-23 LAB — BASIC METABOLIC PANEL
Anion gap: 12 (ref 5–15)
BUN: 14 mg/dL (ref 8–23)
CO2: 27 mmol/L (ref 22–32)
Calcium: 9.4 mg/dL (ref 8.9–10.3)
Chloride: 101 mmol/L (ref 98–111)
Creatinine, Ser: 0.72 mg/dL (ref 0.44–1.00)
GFR, Estimated: >60 mL/min (ref >60)
Glucose, Bld: 107 mg/dL — ABNORMAL HIGH (ref 70–99)
Potassium: 4 mmol/L (ref 3.5–5.1)
Sodium: 140 mmol/L (ref 135–145)

## 2020-11-23 LAB — TROPONIN I (HIGH SENSITIVITY): Troponin I (High Sensitivity): 5 ng/L (ref <18)

## 2020-11-23 NOTE — ED Notes (Signed)
Per registration pt left with her daughter

## 2020-11-23 NOTE — ED Triage Notes (Signed)
Had a twinge  or  2 last night and then this am off and on again left   sided  No radiation to arm or neck she states   No sob she states today, no n/v sh states

## 2020-12-22 DIAGNOSIS — Z794 Long term (current) use of insulin: Secondary | ICD-10-CM | POA: Diagnosis not present

## 2020-12-22 DIAGNOSIS — I10 Essential (primary) hypertension: Secondary | ICD-10-CM | POA: Diagnosis not present

## 2020-12-22 DIAGNOSIS — E1165 Type 2 diabetes mellitus with hyperglycemia: Secondary | ICD-10-CM | POA: Diagnosis not present

## 2020-12-22 DIAGNOSIS — N182 Chronic kidney disease, stage 2 (mild): Secondary | ICD-10-CM | POA: Diagnosis not present

## 2021-03-25 DIAGNOSIS — E1165 Type 2 diabetes mellitus with hyperglycemia: Secondary | ICD-10-CM | POA: Diagnosis not present

## 2021-03-25 DIAGNOSIS — N182 Chronic kidney disease, stage 2 (mild): Secondary | ICD-10-CM | POA: Diagnosis not present

## 2021-03-25 DIAGNOSIS — Z794 Long term (current) use of insulin: Secondary | ICD-10-CM | POA: Diagnosis not present

## 2021-03-25 DIAGNOSIS — I10 Essential (primary) hypertension: Secondary | ICD-10-CM | POA: Diagnosis not present

## 2021-03-25 DIAGNOSIS — E785 Hyperlipidemia, unspecified: Secondary | ICD-10-CM | POA: Diagnosis not present

## 2021-06-30 DIAGNOSIS — Z794 Long term (current) use of insulin: Secondary | ICD-10-CM | POA: Diagnosis not present

## 2021-06-30 DIAGNOSIS — N182 Chronic kidney disease, stage 2 (mild): Secondary | ICD-10-CM | POA: Diagnosis not present

## 2021-06-30 DIAGNOSIS — I10 Essential (primary) hypertension: Secondary | ICD-10-CM | POA: Diagnosis not present

## 2021-06-30 DIAGNOSIS — E785 Hyperlipidemia, unspecified: Secondary | ICD-10-CM | POA: Diagnosis not present

## 2021-06-30 DIAGNOSIS — E1165 Type 2 diabetes mellitus with hyperglycemia: Secondary | ICD-10-CM | POA: Diagnosis not present

## 2021-07-26 DIAGNOSIS — E785 Hyperlipidemia, unspecified: Secondary | ICD-10-CM | POA: Diagnosis not present

## 2021-07-26 DIAGNOSIS — M810 Age-related osteoporosis without current pathological fracture: Secondary | ICD-10-CM | POA: Diagnosis not present

## 2021-07-26 DIAGNOSIS — E1165 Type 2 diabetes mellitus with hyperglycemia: Secondary | ICD-10-CM | POA: Diagnosis not present

## 2021-07-29 DIAGNOSIS — R82998 Other abnormal findings in urine: Secondary | ICD-10-CM | POA: Diagnosis not present

## 2021-07-29 DIAGNOSIS — Z794 Long term (current) use of insulin: Secondary | ICD-10-CM | POA: Diagnosis not present

## 2021-07-29 DIAGNOSIS — I5189 Other ill-defined heart diseases: Secondary | ICD-10-CM | POA: Diagnosis not present

## 2021-07-29 DIAGNOSIS — Z23 Encounter for immunization: Secondary | ICD-10-CM | POA: Diagnosis not present

## 2021-07-29 DIAGNOSIS — Z1331 Encounter for screening for depression: Secondary | ICD-10-CM | POA: Diagnosis not present

## 2021-07-29 DIAGNOSIS — Z1212 Encounter for screening for malignant neoplasm of rectum: Secondary | ICD-10-CM | POA: Diagnosis not present

## 2021-07-29 DIAGNOSIS — E785 Hyperlipidemia, unspecified: Secondary | ICD-10-CM | POA: Diagnosis not present

## 2021-07-29 DIAGNOSIS — E1165 Type 2 diabetes mellitus with hyperglycemia: Secondary | ICD-10-CM | POA: Diagnosis not present

## 2021-07-29 DIAGNOSIS — N182 Chronic kidney disease, stage 2 (mild): Secondary | ICD-10-CM | POA: Diagnosis not present

## 2021-07-29 DIAGNOSIS — I10 Essential (primary) hypertension: Secondary | ICD-10-CM | POA: Diagnosis not present

## 2021-07-29 DIAGNOSIS — M810 Age-related osteoporosis without current pathological fracture: Secondary | ICD-10-CM | POA: Diagnosis not present

## 2021-07-29 DIAGNOSIS — Z0000 Encounter for general adult medical examination without abnormal findings: Secondary | ICD-10-CM | POA: Diagnosis not present

## 2021-09-02 DIAGNOSIS — K921 Melena: Secondary | ICD-10-CM | POA: Diagnosis not present

## 2021-09-02 DIAGNOSIS — M810 Age-related osteoporosis without current pathological fracture: Secondary | ICD-10-CM | POA: Diagnosis not present

## 2021-09-29 DIAGNOSIS — N182 Chronic kidney disease, stage 2 (mild): Secondary | ICD-10-CM | POA: Diagnosis not present

## 2021-09-29 DIAGNOSIS — I10 Essential (primary) hypertension: Secondary | ICD-10-CM | POA: Diagnosis not present

## 2021-09-29 DIAGNOSIS — E785 Hyperlipidemia, unspecified: Secondary | ICD-10-CM | POA: Diagnosis not present

## 2021-09-29 DIAGNOSIS — Z794 Long term (current) use of insulin: Secondary | ICD-10-CM | POA: Diagnosis not present

## 2021-09-29 DIAGNOSIS — E1165 Type 2 diabetes mellitus with hyperglycemia: Secondary | ICD-10-CM | POA: Diagnosis not present

## 2021-10-19 DIAGNOSIS — E1165 Type 2 diabetes mellitus with hyperglycemia: Secondary | ICD-10-CM | POA: Diagnosis not present

## 2021-10-19 DIAGNOSIS — R0602 Shortness of breath: Secondary | ICD-10-CM | POA: Diagnosis not present

## 2021-10-19 DIAGNOSIS — I129 Hypertensive chronic kidney disease with stage 1 through stage 4 chronic kidney disease, or unspecified chronic kidney disease: Secondary | ICD-10-CM | POA: Diagnosis not present

## 2021-10-19 DIAGNOSIS — N182 Chronic kidney disease, stage 2 (mild): Secondary | ICD-10-CM | POA: Diagnosis not present

## 2021-10-19 DIAGNOSIS — I5189 Other ill-defined heart diseases: Secondary | ICD-10-CM | POA: Diagnosis not present

## 2021-10-19 DIAGNOSIS — R0609 Other forms of dyspnea: Secondary | ICD-10-CM | POA: Diagnosis not present

## 2021-11-04 ENCOUNTER — Encounter (HOSPITAL_BASED_OUTPATIENT_CLINIC_OR_DEPARTMENT_OTHER): Payer: Self-pay

## 2021-11-04 ENCOUNTER — Emergency Department (HOSPITAL_BASED_OUTPATIENT_CLINIC_OR_DEPARTMENT_OTHER): Payer: Medicare PPO | Admitting: Radiology

## 2021-11-04 ENCOUNTER — Other Ambulatory Visit (HOSPITAL_BASED_OUTPATIENT_CLINIC_OR_DEPARTMENT_OTHER): Payer: Self-pay

## 2021-11-04 ENCOUNTER — Other Ambulatory Visit: Payer: Self-pay

## 2021-11-04 ENCOUNTER — Emergency Department (HOSPITAL_BASED_OUTPATIENT_CLINIC_OR_DEPARTMENT_OTHER)
Admission: EM | Admit: 2021-11-04 | Discharge: 2021-11-04 | Disposition: A | Discharge status: Home / Self Care | Payer: Medicare PPO | Attending: Emergency Medicine | Admitting: Emergency Medicine

## 2021-11-04 DIAGNOSIS — R0602 Shortness of breath: Secondary | ICD-10-CM | POA: Insufficient documentation

## 2021-11-04 DIAGNOSIS — R059 Cough, unspecified: Secondary | ICD-10-CM | POA: Insufficient documentation

## 2021-11-04 DIAGNOSIS — Z79899 Other long term (current) drug therapy: Secondary | ICD-10-CM | POA: Diagnosis not present

## 2021-11-04 DIAGNOSIS — E119 Type 2 diabetes mellitus without complications: Secondary | ICD-10-CM | POA: Diagnosis not present

## 2021-11-04 DIAGNOSIS — R0981 Nasal congestion: Secondary | ICD-10-CM | POA: Insufficient documentation

## 2021-11-04 DIAGNOSIS — J029 Acute pharyngitis, unspecified: Secondary | ICD-10-CM | POA: Insufficient documentation

## 2021-11-04 DIAGNOSIS — R058 Other specified cough: Secondary | ICD-10-CM

## 2021-11-04 DIAGNOSIS — I517 Cardiomegaly: Secondary | ICD-10-CM | POA: Diagnosis not present

## 2021-11-04 DIAGNOSIS — J069 Acute upper respiratory infection, unspecified: Secondary | ICD-10-CM | POA: Diagnosis not present

## 2021-11-04 DIAGNOSIS — Z20822 Contact with and (suspected) exposure to covid-19: Secondary | ICD-10-CM | POA: Insufficient documentation

## 2021-11-04 DIAGNOSIS — I10 Essential (primary) hypertension: Secondary | ICD-10-CM | POA: Diagnosis not present

## 2021-11-04 DIAGNOSIS — B9789 Other viral agents as the cause of diseases classified elsewhere: Secondary | ICD-10-CM | POA: Diagnosis not present

## 2021-11-04 DIAGNOSIS — Z7984 Long term (current) use of oral hypoglycemic drugs: Secondary | ICD-10-CM | POA: Insufficient documentation

## 2021-11-04 DIAGNOSIS — Z7982 Long term (current) use of aspirin: Secondary | ICD-10-CM | POA: Insufficient documentation

## 2021-11-04 DIAGNOSIS — Z96642 Presence of left artificial hip joint: Secondary | ICD-10-CM | POA: Diagnosis not present

## 2021-11-04 DIAGNOSIS — J40 Bronchitis, not specified as acute or chronic: Secondary | ICD-10-CM | POA: Diagnosis not present

## 2021-11-04 LAB — RESP PANEL BY RT-PCR (FLU A&B, COVID) ARPGX2
Influenza A by PCR: NEGATIVE
Influenza B by PCR: NEGATIVE
SARS Coronavirus 2 by RT PCR: NEGATIVE

## 2021-11-04 MED ORDER — AZITHROMYCIN 250 MG PO TABS
ORAL_TABLET | ORAL | 0 refills | Status: DC
  Filled 2021-11-04: qty 6, 5d supply, fill #0

## 2021-11-04 NOTE — ED Provider Notes (Signed)
[facility] EMERGENCY DEPT Provider Note   CSN: [id number] Arrival date & time: 11/04/21  1412     History Chief Complaint  Patient presents with   Shortness of Breath    Robin Vaughn is a 80 y.o. female.  Patient with non prod cough, congestion, mildly sore throat, for past 2-3 days. Symptoms acute onset, moderate, persistent. No specific known ill contacts. Has been vaccinated for covid and flu.  Denies trouble swallowing. W coughing spell, mild sob, but no sob at baseline. No chest pain or discomfort. No severe headaches. No abd pain or nvd. No dysuria or gu c/o. No swelling, orthopnea or pnd.   The history is provided by the patient and medical records.  Shortness of Breath Associated symptoms: cough and sore throat   Associated symptoms: no abdominal pain, no chest pain, no fever, no headaches, no neck pain, no rash and no vomiting       Past Medical History:  Diagnosis Date   Arthritis    Atypical chest pain    Nuc/GXT 05/02/2006--Post Ejection Fraction 83%---rare PVC's with stress   Bronchitis    Complication of anesthesia    Pt BP dropped during hernia surgery at age 32. Pt stated "I almost died"   Diabetes mellitus without complication (HCC)    Type 2   Diarrhea    D/t metformin   Exertional dyspnea    Family history of adverse reaction to anesthesia    Pts cousin was given anesthesia and he could not speak after anesthesia, although he was conscious   Frequency of urination    GERD (gastroesophageal reflux disease)    History of nephritis    Hypertension    IBS (irritable bowel syndrome)    Hx of   Pneumonia     Patient Active Problem List   Diagnosis Date Noted   Closed fracture of left distal radius 04/18/2018   Essential hypertension 01/20/2017   Dyspnea on exertion 01/20/2017   Atypical chest pain 01/20/2017   Osteoarthritis of left hip 09/01/2015   Status post total replacement of left hip 09/01/2015    Past Surgical History:   Procedure Laterality Date   CARPAL TUNNEL RELEASE Bilateral    CATARACT EXTRACTION Bilateral    COLONOSCOPY W/ POLYPECTOMY     HERNIA REPAIR     NASAL SINUS SURGERY     SHOULDER SURGERY Left    TOTAL HIP ARTHROPLASTY Left 09/01/2015   Procedure: LEFT TOTAL HIP ARTHROPLASTY ANTERIOR APPROACH;  Surgeon: [name], MD;  Location: [facility] OR;  Service: Orthopedics;  Laterality: Left;  NEEDS RNFA     OB History   No obstetric history on file.     Family History  Problem Relation Age of Onset   Diabetes Mother        Borderline in 85's   Lung cancer Father     Social History   Tobacco Use   Smoking status: Never   Smokeless tobacco: Never  Vaping Use   Vaping Use: Never used  Substance Use Topics   Alcohol use: No   Drug use: No    Home Medications Prior to Admission medications   Medication Sig Start Date End Date Taking? Authorizing Provider  amLODipine-benazepril (LOTREL) 5-10 MG per capsule TAKE ONE CAPSULE BY MOUTH ONCE DAILY 10/11/13   Runell Gess, MD  aspirin EC 325 MG EC tablet Take 1 tablet (325 mg total) by mouth 2 (two) times daily after a meal. 09/03/15   Doneen Poisson  Y, MD  Cholecalciferol (VITAMIN D-3 PO) Take 1 capsule by mouth 2 (two) times a week.    [provider]  Coenzyme Q10 (CO Q-10 PO) Take 200 mg by mouth 3 (three) times a week.     [provider]  CRANBERRY PO Take 1 capsule by mouth 3 (three) times a week.    [provider]  metFORMIN (GLUCOPHAGE) 500 MG tablet Take 500 mg by mouth daily.    [provider]  Misc Natural Products (LEG VEIN & CIRCULATION PO) Take 1 tablet by mouth daily.    [provider]  Multiple Vitamin (ESSENTIAL ONE DAILY PO) Take 1 tablet by mouth 3 (three) times a week.    [provider]  OVER THE COUNTER MEDICATION Take 2 capsules by mouth daily. "Basis Nicotinamide Riboside"    [provider]  Probiotic Product (PROBIOTIC PO) Take 1  capsule by mouth daily.    [provider]  Resveratrol 100 MG CAPS Take 100 mg by mouth daily.    [provider]  TURMERIC PO Take 1 capsule by mouth daily.    [provider]    Allergies    Penicillins cross reactors  Review of Systems   Review of Systems  Constitutional:  Negative for chills and fever.  HENT:  Positive for congestion and sore throat.   Eyes:  Negative for redness.  Respiratory:  Positive for cough and shortness of breath.   Cardiovascular:  Negative for chest pain and leg swelling.  Gastrointestinal:  Negative for abdominal pain, diarrhea and vomiting.  Genitourinary:  Negative for dysuria and flank pain.  Musculoskeletal:  Negative for back pain and neck pain.  Skin:  Negative for rash.  Neurological:  Negative for headaches.  Hematological:  Does not bruise/bleed easily.  Psychiatric/Behavioral:  Negative for confusion.    Physical Exam Updated Vital Signs BP 133/66 (BP Location: Right Arm)    Pulse 94    Temp 98.7 F (37.1 C)    Resp 18    Ht 1.588 m (5' 2.5")    Wt 89.8 kg    SpO2 99%    BMI 35.64 kg/m   Physical Exam Vitals and nursing note reviewed.  Constitutional:      Appearance: Normal appearance. She is well-developed.  HENT:     Head: Atraumatic.     Right Ear: Tympanic membrane normal.     Left Ear: Tympanic membrane normal.     Nose: Congestion present.     Mouth/Throat:     Mouth: Mucous membranes are moist.     Pharynx: Oropharynx is clear. No oropharyngeal exudate or posterior oropharyngeal erythema.  Eyes:     General: No scleral icterus.    Conjunctiva/sclera: Conjunctivae normal.  Neck:     Trachea: No tracheal deviation.     Comments: No stiffness or rigidity.  Cardiovascular:     Rate and Rhythm: Normal rate and regular rhythm.     Pulses: Normal pulses.     Heart sounds: Normal heart sounds. No murmur heard.   No friction rub. No gallop.  Pulmonary:     Effort: Pulmonary effort is normal. No  respiratory distress.     Breath sounds: Normal breath sounds.  Abdominal:     General: Bowel sounds are normal. There is no distension.     Palpations: Abdomen is soft.     Tenderness: There is no abdominal tenderness.     Comments: No hsm.   Genitourinary:  Comments: No cva tenderness.  Musculoskeletal:        General: No swelling or tenderness.     Cervical back: Normal range of motion and neck supple. No rigidity. No muscular tenderness.     Right lower leg: No edema.     Left lower leg: No edema.  Lymphadenopathy:     Cervical: No cervical adenopathy.  Skin:    General: Skin is warm and dry.     Findings: No rash.  Neurological:     Mental Status: She is alert.     Comments: Alert, speech normal.   Psychiatric:        Mood and Affect: Mood normal.    ED Results / Procedures / Treatments   Labs (all labs ordered are listed, but only abnormal results are displayed) Results for orders placed or performed during the hospital encounter of 11/04/21  Resp Panel by RT-PCR (Flu A&B, Covid) Nasopharyngeal Swab   Specimen: Nasopharyngeal Swab; Nasopharyngeal(NP) swabs in vial transport medium  Result Value Ref Range   SARS Coronavirus 2 by RT PCR NEGATIVE NEGATIVE   Influenza A by PCR NEGATIVE NEGATIVE   Influenza B by PCR NEGATIVE NEGATIVE   DG Chest 2 View  Result Date: 11/04/2021 CLINICAL DATA:  Shortness of breath.  Cough. EXAM: CHEST - 2 VIEW COMPARISON:  04/07/2012 FINDINGS: Left shoulder arthroplasty. Midline trachea. Mild cardiomegaly. Mediastinal contours otherwise within normal limits. No pleural effusion or pneumothorax. No congestive failure. Clear lungs. IMPRESSION: No acute cardiopulmonary disease. Cardiomegaly without congestive failure. Electronically Signed   By: Jeronimo Greaves M.D.   On: 11/04/2021 14:57     EKG None  Radiology DG Chest 2 View  Result Date: 11/04/2021 CLINICAL DATA:  Shortness of breath.  Cough. EXAM: CHEST - 2 VIEW COMPARISON:   04/07/2012 FINDINGS: Left shoulder arthroplasty. Midline trachea. Mild cardiomegaly. Mediastinal contours otherwise within normal limits. No pleural effusion or pneumothorax. No congestive failure. Clear lungs. IMPRESSION: No acute cardiopulmonary disease. Cardiomegaly without congestive failure. Electronically Signed   By: Jeronimo Greaves M.D.   On: 11/04/2021 14:57    Procedures Procedures   Medications Ordered in ED Medications - No data to display  ED Course  I have reviewed the triage vital signs and the nursing notes.  Pertinent labs & imaging results that were available during my care of the patient were reviewed by me and considered in my medical decision making (see chart for details).    MDM Rules/Calculators/A&P                         Labs and imaging ordered.   Reviewed nursing notes and prior charts for additional history.   CXR reviewed/interpreted by me - no pna.   Labs reviewed/interpreted by me - flu and covid neg.   Pt notes hx 'bronchitis', feels similar.   Will give rx antibiotic.   Pt currently appears stable for d/c.        Final Clinical Impression(s) / ED Diagnoses Final diagnoses:  None    Rx / DC Orders ED Discharge Orders     None        Cathren Laine, MD 11/04/21 1712

## 2021-11-04 NOTE — ED Triage Notes (Signed)
Pt presents with ShOB, cough , sore throat, nasal congestion x 3 days.

## 2021-11-04 NOTE — Discharge Instructions (Addendum)
It was our pleasure to provide your ER care today - we hope that you feel better.  Your covid/flu test is negative. Your xray shows no pneumonia. You may have bronchitis or other upper respiratory illness.   Drink adequate fluids/stay well hydrated.   Take antibiotic as prescribed. You may take over the counter cold/flu medication as need for symptom relief.   Follow up with primary care doctor in 1-2 weeks if symptoms fail to improve/resolve.  Return to ER if worse, new symptoms, increased trouble breathing, chest pain, or other concern.

## 2021-11-12 ENCOUNTER — Other Ambulatory Visit (HOSPITAL_BASED_OUTPATIENT_CLINIC_OR_DEPARTMENT_OTHER): Payer: Self-pay

## 2022-02-04 DIAGNOSIS — R0609 Other forms of dyspnea: Secondary | ICD-10-CM | POA: Diagnosis not present

## 2022-02-04 DIAGNOSIS — I5189 Other ill-defined heart diseases: Secondary | ICD-10-CM | POA: Diagnosis not present

## 2022-02-04 DIAGNOSIS — E1165 Type 2 diabetes mellitus with hyperglycemia: Secondary | ICD-10-CM | POA: Diagnosis not present

## 2022-02-04 DIAGNOSIS — N182 Chronic kidney disease, stage 2 (mild): Secondary | ICD-10-CM | POA: Diagnosis not present

## 2022-02-04 DIAGNOSIS — Z794 Long term (current) use of insulin: Secondary | ICD-10-CM | POA: Diagnosis not present

## 2022-02-04 DIAGNOSIS — E119 Type 2 diabetes mellitus without complications: Secondary | ICD-10-CM | POA: Diagnosis not present

## 2022-02-04 DIAGNOSIS — M810 Age-related osteoporosis without current pathological fracture: Secondary | ICD-10-CM | POA: Diagnosis not present

## 2022-02-04 DIAGNOSIS — I10 Essential (primary) hypertension: Secondary | ICD-10-CM | POA: Diagnosis not present

## 2022-03-02 DIAGNOSIS — E119 Type 2 diabetes mellitus without complications: Secondary | ICD-10-CM | POA: Diagnosis not present

## 2022-06-23 DIAGNOSIS — E785 Hyperlipidemia, unspecified: Secondary | ICD-10-CM | POA: Diagnosis not present

## 2022-06-23 DIAGNOSIS — E1165 Type 2 diabetes mellitus with hyperglycemia: Secondary | ICD-10-CM | POA: Diagnosis not present

## 2022-06-23 DIAGNOSIS — I10 Essential (primary) hypertension: Secondary | ICD-10-CM | POA: Diagnosis not present

## 2022-06-23 DIAGNOSIS — Z794 Long term (current) use of insulin: Secondary | ICD-10-CM | POA: Diagnosis not present

## 2022-06-23 DIAGNOSIS — N182 Chronic kidney disease, stage 2 (mild): Secondary | ICD-10-CM | POA: Diagnosis not present

## 2022-07-28 DIAGNOSIS — E1165 Type 2 diabetes mellitus with hyperglycemia: Secondary | ICD-10-CM | POA: Diagnosis not present

## 2022-07-28 DIAGNOSIS — E785 Hyperlipidemia, unspecified: Secondary | ICD-10-CM | POA: Diagnosis not present

## 2022-07-28 DIAGNOSIS — I10 Essential (primary) hypertension: Secondary | ICD-10-CM | POA: Diagnosis not present

## 2022-07-28 DIAGNOSIS — R7989 Other specified abnormal findings of blood chemistry: Secondary | ICD-10-CM | POA: Diagnosis not present

## 2022-07-28 DIAGNOSIS — M810 Age-related osteoporosis without current pathological fracture: Secondary | ICD-10-CM | POA: Diagnosis not present

## 2022-08-04 DIAGNOSIS — N182 Chronic kidney disease, stage 2 (mild): Secondary | ICD-10-CM | POA: Diagnosis not present

## 2022-08-04 DIAGNOSIS — I10 Essential (primary) hypertension: Secondary | ICD-10-CM | POA: Diagnosis not present

## 2022-08-04 DIAGNOSIS — M810 Age-related osteoporosis without current pathological fracture: Secondary | ICD-10-CM | POA: Diagnosis not present

## 2022-08-04 DIAGNOSIS — Z794 Long term (current) use of insulin: Secondary | ICD-10-CM | POA: Diagnosis not present

## 2022-08-04 DIAGNOSIS — Z0000 Encounter for general adult medical examination without abnormal findings: Secondary | ICD-10-CM | POA: Diagnosis not present

## 2022-08-04 DIAGNOSIS — R82998 Other abnormal findings in urine: Secondary | ICD-10-CM | POA: Diagnosis not present

## 2022-08-04 DIAGNOSIS — I5189 Other ill-defined heart diseases: Secondary | ICD-10-CM | POA: Diagnosis not present

## 2022-08-04 DIAGNOSIS — E1165 Type 2 diabetes mellitus with hyperglycemia: Secondary | ICD-10-CM | POA: Diagnosis not present

## 2022-08-04 DIAGNOSIS — E785 Hyperlipidemia, unspecified: Secondary | ICD-10-CM | POA: Diagnosis not present

## 2022-11-22 DIAGNOSIS — Z23 Encounter for immunization: Secondary | ICD-10-CM | POA: Diagnosis not present

## 2023-01-04 DIAGNOSIS — N182 Chronic kidney disease, stage 2 (mild): Secondary | ICD-10-CM | POA: Diagnosis not present

## 2023-01-04 DIAGNOSIS — Z794 Long term (current) use of insulin: Secondary | ICD-10-CM | POA: Diagnosis not present

## 2023-01-04 DIAGNOSIS — E1165 Type 2 diabetes mellitus with hyperglycemia: Secondary | ICD-10-CM | POA: Diagnosis not present

## 2023-01-04 DIAGNOSIS — I10 Essential (primary) hypertension: Secondary | ICD-10-CM | POA: Diagnosis not present

## 2023-01-04 DIAGNOSIS — E785 Hyperlipidemia, unspecified: Secondary | ICD-10-CM | POA: Diagnosis not present

## 2023-02-23 ENCOUNTER — Other Ambulatory Visit (INDEPENDENT_AMBULATORY_CARE_PROVIDER_SITE_OTHER): Payer: Medicare PPO

## 2023-02-23 ENCOUNTER — Encounter: Payer: Self-pay | Admitting: Orthopaedic Surgery

## 2023-02-23 ENCOUNTER — Ambulatory Visit (INDEPENDENT_AMBULATORY_CARE_PROVIDER_SITE_OTHER): Payer: Medicare PPO | Admitting: Orthopaedic Surgery

## 2023-02-23 DIAGNOSIS — N182 Chronic kidney disease, stage 2 (mild): Secondary | ICD-10-CM | POA: Diagnosis not present

## 2023-02-23 DIAGNOSIS — I872 Venous insufficiency (chronic) (peripheral): Secondary | ICD-10-CM | POA: Diagnosis not present

## 2023-02-23 DIAGNOSIS — Z794 Long term (current) use of insulin: Secondary | ICD-10-CM | POA: Diagnosis not present

## 2023-02-23 DIAGNOSIS — I5189 Other ill-defined heart diseases: Secondary | ICD-10-CM | POA: Diagnosis not present

## 2023-02-23 DIAGNOSIS — E785 Hyperlipidemia, unspecified: Secondary | ICD-10-CM | POA: Diagnosis not present

## 2023-02-23 DIAGNOSIS — I10 Essential (primary) hypertension: Secondary | ICD-10-CM | POA: Diagnosis not present

## 2023-02-23 DIAGNOSIS — M25551 Pain in right hip: Secondary | ICD-10-CM | POA: Diagnosis not present

## 2023-02-23 DIAGNOSIS — M810 Age-related osteoporosis without current pathological fracture: Secondary | ICD-10-CM | POA: Diagnosis not present

## 2023-02-23 DIAGNOSIS — E1165 Type 2 diabetes mellitus with hyperglycemia: Secondary | ICD-10-CM | POA: Diagnosis not present

## 2023-02-23 NOTE — Progress Notes (Signed)
Patient is well-known to me.  She is an 82 year old female who we replaced her left hip successfully in 2016.  She has been developing worsening right hip pain for over 8 months now.  She does hurt with ambulation and mobility is limited secondary to right hip pain with weightbearing and range of motion.  She has groin pain.  This is been slowly getting worse for her.  She tries to stay as active as possible.  She is a diabetic but reports good blood glucose control.  She has high blood pressure and has good control over this as well.  I was able to review her medications within epic.  Her daughter is with her today as well.  On exam her left hip that we replaced in 2016 moves smoothly and fluidly.  Her right hip has significant pain on rotation and with weightbearing but no blocks to rotation.  An AP pelvis lateral the right hip shows severe end-stage arthritis of the right hip with bone-on-bone wear.  There is complete loss of the joint space with sclerotic and cystic changes as well as osteophytes around the hip.  The left total hip appears stable.  This point I recommended a right total hip arthroplasty based on her clinical exam findings or signs and symptoms and her x-ray findings.  Having had this before she is fully aware of the risks and benefits of the surgery.  We discussed what to expect from an intraoperative and postoperative course.  All question concerns were answered and addressed.  Will work on getting on the schedule.

## 2023-03-31 ENCOUNTER — Telehealth: Payer: Self-pay

## 2023-03-31 NOTE — Telephone Encounter (Signed)
I called patient to return call regarding scheduling surgery.  Left 2nd voice mail today for return call to schedule.  1st voice mail left 03/03/23.

## 2023-04-05 ENCOUNTER — Telehealth: Payer: Self-pay

## 2023-04-05 NOTE — Telephone Encounter (Signed)
Patient left me a voice mail to call her back.  I called her and left her a voice mail for a return call.

## 2023-04-18 ENCOUNTER — Other Ambulatory Visit: Payer: Self-pay

## 2023-04-21 ENCOUNTER — Encounter (HOSPITAL_COMMUNITY): Payer: Self-pay

## 2023-04-24 NOTE — Patient Instructions (Signed)
SURGICAL WAITING ROOM VISITATION  Patients having surgery or a procedure may have no more than 2 support people in the waiting area - these visitors may rotate.    Children under the age of 16 must have an adult with them who is not the patient.  Due to an increase in RSV and influenza rates and associated hospitalizations, children ages 41 and under may not visit patients in [facility] hospitals.  If the patient needs to stay at the hospital during part of their recovery, the visitor guidelines for inpatient rooms apply. Pre-op nurse will coordinate an appropriate time for 1 support person to accompany patient in pre-op.  This support person may not rotate.    Please refer to the [facility] website for the visitor guidelines for Inpatients (after your surgery is over and you are in a regular room).    Your procedure is scheduled on: 05/05/23   Report to [facility] Main Entrance    Report to admitting at 8:30 AM   Call this number if you have problems the morning of surgery [phone]   Do not eat food :After Midnight.   After Midnight you may have the following liquids until 8:00 AM DAY OF SURGERY  Water Non-Citrus Juices (without pulp, NO RED-Apple, White grape, White cranberry) Black Coffee (NO MILK/CREAM OR CREAMERS, sugar ok)  Clear Tea (NO MILK/CREAM OR CREAMERS, sugar ok) regular and decaf                             Plain Jell-O (NO RED)                                           Fruit ices (not with fruit pulp, NO RED)                                     Popsicles (NO RED)                                                               Sports drinks like Gatorade (NO RED)                  The day of surgery:  Drink ONE (1) Pre-Surgery G2 at 8:00 AM the morning of surgery. Drink in one sitting. Do not sip.  This drink was given to you during your hospital  pre-op appointment visit. Nothing else to drink after completing the  Pre-Surgery G2.          If you  have questions, please contact your surgeon's office.   FOLLOW BOWEL PREP AND ANY ADDITIONAL PRE OP INSTRUCTIONS YOU RECEIVED FROM YOUR SURGEON'S OFFICE!!!     Oral Hygiene is also important to reduce your risk of infection.                                    Remember - BRUSH YOUR TEETH THE MORNING OF SURGERY WITH YOUR REGULAR TOOTHPASTE  DENTURES WILL BE  REMOVED PRIOR TO SURGERY PLEASE DO NOT APPLY "Poly grip" OR ADHESIVES!!!   Take these medicines the morning of surgery with A SIP OF WATER: None  DO NOT TAKE ANY ORAL DIABETIC MEDICATIONS DAY OF YOUR SURGERY  How to Manage Your Diabetes Before and After Surgery  Why is it important to control my blood sugar before and after surgery? Improving blood sugar levels before and after surgery helps healing and can limit problems. A way of improving blood sugar control is eating a healthy diet by:  Eating less sugar and carbohydrates  Increasing activity/exercise  Talking with your doctor about reaching your blood sugar goals High blood sugars (greater than 180 mg/dL) can raise your risk of infections and slow your recovery, so you will need to focus on controlling your diabetes during the weeks before surgery. Make sure that the doctor who takes care of your diabetes knows about your planned surgery including the date and location.  How do I manage my blood sugar before surgery? Check your blood sugar at least 4 times a day, starting 2 days before surgery, to make sure that the level is not too high or low. Check your blood sugar the morning of your surgery when you wake up and every 2 hours until you get to the Short Stay unit. If your blood sugar is less than 70 mg/dL, you will need to treat for low blood sugar: Do not take insulin. Treat a low blood sugar (less than 70 mg/dL) with  cup of clear juice (cranberry or apple), 4 glucose tablets, OR glucose gel. Recheck blood sugar in 15 minutes after treatment (to make sure it is greater  than 70 mg/dL). If your blood sugar is not greater than 70 mg/dL on recheck, call [phone] for further instructions. Report your blood sugar to the short stay nurse when you get to Short Stay.  If you are admitted to the hospital after surgery: Your blood sugar will be checked by the staff and you will probably be given insulin after surgery (instead of oral diabetes medicines) to make sure you have good blood sugar levels. The goal for blood sugar control after surgery is 80-180 mg/dL.   WHAT DO I DO ABOUT MY DIABETES MEDICATION?  Do not take oral diabetes medicines (pills) the morning of surgery.  THE DAY BEFORE SURGERY, take [place] and Metformin as prescribed.      THE MORNING OF SURGERY, take 50% of [place]. Do not take Metformin.  Reviewed and Endorsed by [facility] Patient Education Committee, August 2015             You may not have any metal on your body including hair pins, jewelry, and body piercing             Do not wear make-up, lotions, powders, perfumes, or deodorant  Do not wear nail polish including gel and S&S, artificial/acrylic nails, or any other type of covering on natural nails including finger and toenails. If you have artificial nails, gel coating, etc. that needs to be removed by a nail salon please have this removed prior to surgery or surgery may need to be canceled/ delayed if the surgeon/ anesthesia feels like they are unable to be safely monitored.   Do not shave  48 hours prior to surgery.    Do not bring valuables to the hospital. [facility] IS NOT             RESPONSIBLE   FOR VALUABLES.   Contacts,  glasses, dentures or bridgework may not be worn into surgery.   Bring small overnight bag day of surgery.   DO NOT BRING YOUR HOME MEDICATIONS TO THE HOSPITAL. PHARMACY WILL DISPENSE MEDICATIONS LISTED ON YOUR MEDICATION LIST TO YOU DURING YOUR ADMISSION IN THE HOSPITAL!   Special Instructions: Bring a copy of your healthcare power of attorney  and living will documents the day of surgery if you haven't scanned them before.              Please read over the following fact sheets you were given: IF YOU HAVE QUESTIONS ABOUT YOUR PRE-OP INSTRUCTIONS PLEASE CALL [phone] [facility]   If you received a COVID test during your pre-op visit  it is requested that you wear a mask when out in public, stay away from anyone that may not be feeling well and notify your surgeon if you develop symptoms. If you test positive for Covid or have been in contact with anyone that has tested positive in the last 10 days please notify you surgeon.      Pre-operative 5 CHG Bath Instructions   You can play a key role in reducing the risk of infection after surgery. Your skin needs to be as free of germs as possible. You can reduce the number of germs on your skin by washing with CHG (chlorhexidine gluconate) soap before surgery. CHG is an antiseptic soap that kills germs and continues to kill germs even after washing.   DO NOT use if you have an allergy to chlorhexidine/CHG or antibacterial soaps. If your skin becomes reddened or irritated, stop using the CHG and notify one of our RNs at [phone].   Please shower with the CHG soap starting 4 days before surgery using the following schedule:     Please keep in mind the following:  DO NOT shave, including legs and underarms, starting the day of your first shower.   You may shave your face at any point before/day of surgery.  Place clean sheets on your bed the day you start using CHG soap. Use a clean washcloth (not used since being washed) for each shower. DO NOT sleep with pets once you start using the CHG.   CHG Shower Instructions:  If you choose to wash your hair and private area, wash first with your normal shampoo/soap.  After you use shampoo/soap, rinse your hair and body thoroughly to remove shampoo/soap residue.  Turn the water OFF and apply about 3 tablespoons (45 ml) of CHG soap to a CLEAN  washcloth.  Apply CHG soap ONLY FROM YOUR NECK DOWN TO YOUR TOES (washing for 3-5 minutes)  DO NOT use CHG soap on face, private areas, open wounds, or sores.  Pay special attention to the area where your surgery is being performed.  If you are having back surgery, having someone wash your back for you may be helpful. Wait 2 minutes after CHG soap is applied, then you may rinse off the CHG soap.  Pat dry with a clean towel  Put on clean clothes/pajamas   If you choose to wear lotion, please use ONLY the CHG-compatible lotions on the back of this paper.     Additional instructions for the day of surgery: DO NOT APPLY any lotions, deodorants, cologne, or perfumes.   Put on clean/comfortable clothes.  Brush your teeth.  Ask your nurse before applying any prescription medications to the skin.      CHG Compatible Lotions   Aveeno Moisturizing lotion  Cetaphil Moisturizing  Cream  Cetaphil Moisturizing Lotion  Clairol Herbal Essence Moisturizing Lotion, Dry Skin  Clairol Herbal Essence Moisturizing Lotion, Extra Dry Skin  Clairol Herbal Essence Moisturizing Lotion, Normal Skin  Curel Age Defying Therapeutic Moisturizing Lotion with Alpha Hydroxy  Curel Extreme Care Body Lotion  Curel Soothing Hands Moisturizing Hand Lotion  Curel Therapeutic Moisturizing Cream, Fragrance-Free  Curel Therapeutic Moisturizing Lotion, Fragrance-Free  Curel Therapeutic Moisturizing Lotion, Original Formula  Eucerin Daily Replenishing Lotion  Eucerin Dry Skin Therapy Plus Alpha Hydroxy Crme  Eucerin Dry Skin Therapy Plus Alpha Hydroxy Lotion  Eucerin Original Crme  Eucerin Original Lotion  Eucerin Plus Crme Eucerin Plus Lotion  Eucerin TriLipid Replenishing Lotion  Keri Anti-Bacterial Hand Lotion  Keri Deep Conditioning Original Lotion Dry Skin Formula Softly Scented  Keri Deep Conditioning Original Lotion, Fragrance Free Sensitive Skin Formula  Keri Lotion Fast Absorbing Fragrance Free Sensitive  Skin Formula  Keri Lotion Fast Absorbing Softly Scented Dry Skin Formula  Keri Original Lotion  Keri Skin Renewal Lotion Keri Silky Smooth Lotion  Keri Silky Smooth Sensitive Skin Lotion  Nivea Body Creamy Conditioning Oil  Nivea Body Extra Enriched Teacher, adult education Moisturizing Lotion Nivea Crme  Nivea Skin Firming Lotion  NutraDerm 30 Skin Lotion  NutraDerm Skin Lotion  NutraDerm Therapeutic Skin Cream  NutraDerm Therapeutic Skin Lotion  ProShield Protective Hand Cream  Provon moisturizing lotion  WHAT IS A BLOOD TRANSFUSION? Blood Transfusion Information  A transfusion is the replacement of blood or some of its parts. Blood is made up of multiple cells which provide different functions. Red blood cells carry oxygen and are used for blood loss replacement. White blood cells fight against infection. Platelets control bleeding. Plasma helps clot blood. Other blood products are available for specialized needs, such as hemophilia or other clotting disorders. BEFORE THE TRANSFUSION  Who gives blood for transfusions?  Healthy volunteers who are fully evaluated to make sure their blood is safe. This is blood bank blood. Transfusion therapy is the safest it has ever been in the practice of medicine. Before blood is taken from a donor, a complete history is taken to make sure that person has no history of diseases nor engages in risky social behavior (examples are intravenous drug use or sexual activity with multiple partners). The donor's travel history is screened to minimize risk of transmitting infections, such as malaria. The donated blood is tested for signs of infectious diseases, such as HIV and hepatitis. The blood is then tested to be sure it is compatible with you in order to minimize the chance of a transfusion reaction. If you or a relative donates blood, this is often done in anticipation of surgery and is not appropriate for emergency  situations. It takes many days to process the donated blood. RISKS AND COMPLICATIONS Although transfusion therapy is very safe and saves many lives, the main dangers of transfusion include:  Getting an infectious disease. Developing a transfusion reaction. This is an allergic reaction to something in the blood you were given. Every precaution is taken to prevent this. The decision to have a blood transfusion has been considered carefully by your caregiver before blood is given. Blood is not given unless the benefits outweigh the risks. AFTER THE TRANSFUSION Right after receiving a blood transfusion, you will usually feel much better and more energetic. This is especially true if your red blood cells have gotten low (anemic). The transfusion raises the level of the red blood cells which carry oxygen,  and this usually causes an energy increase. The nurse administering the transfusion will monitor you carefully for complications. HOME CARE INSTRUCTIONS  No special instructions are needed after a transfusion. You may find your energy is better. Speak with your caregiver about any limitations on activity for underlying diseases you may have. SEEK MEDICAL CARE IF:  Your condition is not improving after your transfusion. You develop redness or irritation at the intravenous (IV) site. SEEK IMMEDIATE MEDICAL CARE IF:  Any of the following symptoms occur over the next 12 hours: Shaking chills. You have a temperature by mouth above 102 F (38.9 C), not controlled by medicine. Chest, back, or muscle pain. People around you feel you are not acting correctly or are confused. Shortness of breath or difficulty breathing. Dizziness and fainting. You get a rash or develop hives. You have a decrease in urine output. Your urine turns a dark color or changes to pink, red, or brown. Any of the following symptoms occur over the next 10 days: You have a temperature by mouth above 102 F (38.9 C), not controlled  by medicine. Shortness of breath. Weakness after normal activity. The white part of the eye turns yellow (jaundice). You have a decrease in the amount of urine or are urinating less often. Your urine turns a dark color or changes to pink, red, or brown. Document Released: 10/28/2000 Document Revised: 01/23/2012 Document Reviewed: 06/16/2008 [employer] Patient Information 2014 [place], [place].  _______________________________________________________________________  Incentive Spirometer  An incentive spirometer is a tool that can help keep your lungs clear and active. This tool measures how well you are filling your lungs with each breath. Taking long deep breaths may help reverse or decrease the chance of developing breathing (pulmonary) problems (especially infection) following: A long period of time when you are unable to move or be active. BEFORE THE PROCEDURE  If the spirometer includes an indicator to show your best effort, your nurse or respiratory therapist will set it to a desired goal. If possible, sit up straight or lean slightly forward. Try not to slouch. Hold the incentive spirometer in an upright position. INSTRUCTIONS FOR USE  Sit on the edge of your bed if possible, or sit up as far as you can in bed or on a chair. Hold the incentive spirometer in an upright position. Breathe out normally. Place the mouthpiece in your mouth and seal your lips tightly around it. Breathe in slowly and as deeply as possible, raising the piston or the ball toward the top of the column. Hold your breath for 3-5 seconds or for as long as possible. Allow the piston or ball to fall to the bottom of the column. Remove the mouthpiece from your mouth and breathe out normally. Rest for a few seconds and repeat Steps 1 through 7 at least 10 times every 1-2 hours when you are awake. Take your time and take a few normal breaths between deep breaths. The spirometer may include an indicator to show your best  effort. Use the indicator as a goal to work toward during each repetition. After each set of 10 deep breaths, practice coughing to be sure your lungs are clear. If you have an incision (the cut made at the time of surgery), support your incision when coughing by placing a pillow or rolled up towels firmly against it. Once you are able to get out of bed, walk around indoors and cough well. You may stop using the incentive spirometer when instructed by your caregiver.  RISKS  AND COMPLICATIONS Take your time so you do not get dizzy or light-headed. If you are in pain, you may need to take or ask for pain medication before doing incentive spirometry. It is harder to take a deep breath if you are having pain. AFTER USE Rest and breathe slowly and easily. It can be helpful to keep track of a log of your progress. Your caregiver can provide you with a simple table to help with this. If you are using the spirometer at home, follow these instructions: SEEK MEDICAL CARE IF:  You are having difficultly using the spirometer. You have trouble using the spirometer as often as instructed. Your pain medication is not giving enough relief while using the spirometer. You develop fever of 100.5 F (38.1 C) or higher. SEEK IMMEDIATE MEDICAL CARE IF:  You cough up bloody sputum that had not been present before. You develop fever of 102 F (38.9 C) or greater. You develop worsening pain at or near the incision site. MAKE SURE YOU:  Understand these instructions. Will watch your condition. Will get help right away if you are not doing well or get worse. Document Released: 03/13/2007 Document Revised: 01/23/2012 Document Reviewed: 05/14/2007 [facility] Patient Information 2014 [place], [place].   ________________________________________________________________________

## 2023-04-24 NOTE — Progress Notes (Signed)
Sent message, via epic in basket, requesting orders in epic from surgeon.  

## 2023-04-24 NOTE — Progress Notes (Signed)
COVID Vaccine Completed:  Date of COVID positive in last 90 days:  PCP - Creola Corn, MD Cardiologist - Nanetta Batty LOV 03/15/17  Chest x-ray -  EKG -  Stress Test - 01/26/17 Epic ECHO - 02/10/17 Epic Cardiac Cath -  Pacemaker/ICD device last checked: Spinal Cord Stimulator:  Bowel Prep -   Sleep Study -  CPAP -   Fasting Blood Sugar -  Checks Blood Sugar _____ times a day  Last dose of GLP1 agonist-  N/A GLP1 instructions:  N/A   Last dose of SGLT-2 inhibitors-  N/A SGLT-2 instructions: N/A   Blood Thinner Instructions:  Time Aspirin Instructions: ASA 325 Last Dose:  Activity level:  Can go up a flight of stairs and perform activities of daily living without stopping and without symptoms of chest pain or shortness of breath.  Able to exercise without symptoms  Unable to go up a flight of stairs without symptoms of     Anesthesia review:   Patient denies shortness of breath, fever, cough and chest pain at PAT appointment  Patient verbalized understanding of instructions that were given to them at the PAT appointment. Patient was also instructed that they will need to review over the PAT instructions again at home before surgery.

## 2023-04-26 ENCOUNTER — Telehealth: Payer: Self-pay | Admitting: Orthopaedic Surgery

## 2023-04-26 ENCOUNTER — Other Ambulatory Visit: Payer: Self-pay

## 2023-04-26 ENCOUNTER — Encounter (HOSPITAL_COMMUNITY): Payer: Self-pay

## 2023-04-26 ENCOUNTER — Encounter (HOSPITAL_COMMUNITY)
Admission: RE | Admit: 2023-04-26 | Discharge: 2023-04-26 | Disposition: A | Discharge status: Home / Self Care | Payer: Medicare PPO | Source: Ambulatory Visit | Attending: Orthopaedic Surgery | Admitting: Orthopaedic Surgery

## 2023-04-26 VITALS — BP 136/78 | HR 93 | Temp 98.4°F | Resp 16 | Ht 62.0 in | Wt 191.0 lb

## 2023-04-26 DIAGNOSIS — I10 Essential (primary) hypertension: Secondary | ICD-10-CM | POA: Diagnosis not present

## 2023-04-26 DIAGNOSIS — Z01818 Encounter for other preprocedural examination: Secondary | ICD-10-CM | POA: Insufficient documentation

## 2023-04-26 DIAGNOSIS — M1611 Unilateral primary osteoarthritis, right hip: Secondary | ICD-10-CM | POA: Diagnosis not present

## 2023-04-26 DIAGNOSIS — E119 Type 2 diabetes mellitus without complications: Secondary | ICD-10-CM | POA: Insufficient documentation

## 2023-04-26 DIAGNOSIS — Z794 Long term (current) use of insulin: Secondary | ICD-10-CM | POA: Insufficient documentation

## 2023-04-26 LAB — COMPREHENSIVE METABOLIC PANEL
ALT: 15 U/L (ref 0–44)
AST: 17 U/L (ref 15–41)
Albumin: 3.9 g/dL (ref 3.5–5.0)
Alkaline Phosphatase: 55 U/L (ref 38–126)
Anion gap: 8 (ref 5–15)
BUN: 16 mg/dL (ref 8–23)
CO2: 26 mmol/L (ref 22–32)
Calcium: 9.3 mg/dL (ref 8.9–10.3)
Chloride: 103 mmol/L (ref 98–111)
Creatinine, Ser: 0.83 mg/dL (ref 0.44–1.00)
GFR, Estimated: >60 mL/min (ref >60)
Glucose, Bld: 101 mg/dL — ABNORMAL HIGH (ref 70–99)
Potassium: 4.2 mmol/L (ref 3.5–5.1)
Sodium: 137 mmol/L (ref 135–145)
Total Bilirubin: 0.4 mg/dL (ref 0.3–1.2)
Total Protein: 7.2 g/dL (ref 6.5–8.1)

## 2023-04-26 LAB — SURGICAL PCR SCREEN
MRSA, PCR: NEGATIVE
Staphylococcus aureus: POSITIVE — AB

## 2023-04-26 LAB — CBC
HCT: 39.6 % (ref 36.0–46.0)
Hemoglobin: 12.7 g/dL (ref 12.0–15.0)
MCH: 28.2 pg (ref 26.0–34.0)
MCHC: 32.1 g/dL (ref 30.0–36.0)
MCV: 88 fL (ref 80.0–100.0)
Platelets: 158 10*3/uL (ref 150–400)
RBC: 4.5 MIL/uL (ref 3.87–5.11)
RDW: 14 % (ref 11.5–15.5)
WBC: 6.6 10*3/uL (ref 4.0–10.5)
nRBC: 0 % (ref 0.0–0.2)

## 2023-04-26 LAB — HEMOGLOBIN A1C
Hgb A1c MFr Bld: 6.4 % — ABNORMAL HIGH (ref 4.8–5.6)
Mean Plasma Glucose: 136.98 mg/dL

## 2023-04-26 LAB — TYPE AND SCREEN
ABO/RH(D): O POS
Antibody Screen: NEGATIVE

## 2023-04-26 LAB — GLUCOSE, CAPILLARY: Glucose-Capillary: 116 mg/dL — ABNORMAL HIGH (ref 70–99)

## 2023-04-26 NOTE — Telephone Encounter (Signed)
Patient PCP Creola Corn, MD needs a clearance sheet faxed over for patients upcoming surgery 05/05/23 fax number [phone]

## 2023-04-27 NOTE — Progress Notes (Signed)
[facility] + results routed to Dr. Blackman 

## 2023-04-28 NOTE — Telephone Encounter (Signed)
Faxed

## 2023-05-04 ENCOUNTER — Telehealth: Payer: Self-pay | Admitting: *Deleted

## 2023-05-04 DIAGNOSIS — M1611 Unilateral primary osteoarthritis, right hip: Secondary | ICD-10-CM | POA: Insufficient documentation

## 2023-05-04 NOTE — H&P (Signed)
TOTAL HIP ADMISSION H&P  Patient is admitted for right total hip arthroplasty.  Subjective:  Chief Complaint: right hip pain  HPI: Robin Vaughn, 82 y.o. female, has a history of pain and functional disability in the right hip(s) due to arthritis and patient has failed non-surgical conservative treatments for greater than 12 weeks to include flexibility and strengthening excercises, use of assistive devices, and activity modification.  Onset of symptoms was gradual starting 1 years ago with gradually worsening course since that time.The patient noted no past surgery on the right hip(s).  Patient currently rates pain in the right hip at 10 out of 10 with activity. Patient has night pain, worsening of pain with activity and weight bearing, pain that interfers with activities of daily living, and pain with passive range of motion. Patient has evidence of subchondral sclerosis, periarticular osteophytes, and joint space narrowing by imaging studies. This condition presents safety issues increasing the risk of falls.  There is no current active infection.  Patient Active Problem List   Diagnosis Date Noted   Unilateral primary osteoarthritis, right hip 05/04/2023   Closed fracture of left distal radius 04/18/2018   Essential hypertension 01/20/2017   Dyspnea on exertion 01/20/2017   Atypical chest pain 01/20/2017   Status post total replacement of left hip 09/01/2015   Past Medical History:  Diagnosis Date   Arthritis    Atypical chest pain    Nuc/GXT 05/02/2006--Post Ejection Fraction 83%---rare PVC's with stress   Bronchitis    Complication of anesthesia    Pt BP dropped during hernia surgery at age 14. Pt stated "I almost died"   Diabetes mellitus without complication (HCC)    Type 2   Diarrhea    D/t metformin   Exertional dyspnea    Family history of adverse reaction to anesthesia    Pts cousin was given anesthesia and he could not speak after anesthesia, although he was conscious    Frequency of urination    GERD (gastroesophageal reflux disease)    History of nephritis    Hypertension    IBS (irritable bowel syndrome)    Hx of   Pneumonia     Past Surgical History:  Procedure Laterality Date   CARPAL TUNNEL RELEASE Bilateral    CATARACT EXTRACTION Bilateral    COLONOSCOPY W/ POLYPECTOMY     HERNIA REPAIR     NASAL SINUS SURGERY     SHOULDER SURGERY Left    TOTAL HIP ARTHROPLASTY Left 09/01/2015   Procedure: LEFT TOTAL HIP ARTHROPLASTY ANTERIOR APPROACH;  Surgeon: [name], MD;  Location: [facility] OR;  Service: Orthopedics;  Laterality: Left;  NEEDS RNFA    No current facility-administered medications for this encounter.   Current Outpatient Medications  Medication Sig Dispense Refill Last Dose   amLODipine-benazepril (LOTREL) 5-10 MG per capsule TAKE ONE CAPSULE BY MOUTH ONCE DAILY (Patient taking differently: Take 1 capsule by mouth at bedtime.) 30 capsule 4    Apple Cider Vinegar 500 MG TABS Take 500 mg by mouth 2 (two) times daily.      Cholecalciferol (VITAMIN D3) 50 MCG (2000 UT) capsule Take 4,000 Units by mouth daily.      Coenzyme Q10 (CO Q-10) 200 MG CAPS Take 200 mg by mouth daily.      metFORMIN (GLUCOPHAGE) 500 MG tablet Take 500 mg by mouth 3 (three) times daily.      Misc Natural Products (LEG VEIN & CIRCULATION PO) Take 1 tablet by mouth daily.  Multiple Vitamin (ESSENTIAL ONE DAILY PO) Take 1 tablet by mouth daily.      Multiple Vitamins-Minerals (AIRBORNE PO) Take 1 tablet by mouth daily. In Water      Multiple Vitamins-Minerals (EYE SUPPORT PO) Take 1 capsule by mouth 2 (two) times a week. Ultimate eye support      Omega-3 Fatty Acids (FISH OIL PO) Take 1,400 mg by mouth daily. Wild Engineer, petroleum fish oil      OVER THE COUNTER MEDICATION Take 1 capsule by mouth daily. Dermal repair complex reviews      OVER THE COUNTER MEDICATION Take 1 capsule by mouth 2 (two) times a week. Liver antidote extract      OVER THE COUNTER MEDICATION  Take 1 capsule by mouth 2 (two) times a week. liver antidote extract      Polyethyl Glycol-Propyl Glycol (SYSTANE ULTRA) 0.4-0.3 % SOLN Place 1 drop into both eyes at bedtime.      Probiotic Product (PROBIOTIC PO) Take 1 capsule by mouth 3 (three) times a week. Ultimate friendly flora 25 billion cfu      Resveratrol 100 MG CAPS Take 100 mg by mouth daily.      Sodium Hyaluronate, oral, (HYALURONIC ACID PO) Take 1 capsule by mouth daily. Ultima      TRESIBA FLEXTOUCH 200 UNIT/ML FlexTouch Pen Inject 22 Units into the skin daily.      Turmeric 500 MG TABS Take 500 mg by mouth daily. Black Pepper      vitamin C (ASCORBIC ACID) 250 MG tablet Take 500 mg by mouth daily. Gummy      Allergies  Allergen Reactions   Penicillins Cross Reactors Other (See Comments)    Hallucinations and erythema    Social History   Tobacco Use   Smoking status: Never   Smokeless tobacco: Never  Substance Use Topics   Alcohol use: No    Family History  Problem Relation Age of Onset   Diabetes Mother        Borderline in 59's   Lung cancer Father      Review of Systems  Objective:  Physical Exam Vitals reviewed.  Constitutional:      Appearance: Normal appearance. She is normal weight.  HENT:     Head: Normocephalic and atraumatic.  Eyes:     Extraocular Movements: Extraocular movements intact.     Pupils: Pupils are equal, round, and reactive to light.  Cardiovascular:     Rate and Rhythm: Normal rate.     Pulses: Normal pulses.  Pulmonary:     Effort: Pulmonary effort is normal.     Breath sounds: Normal breath sounds.  Abdominal:     Palpations: Abdomen is soft.  Musculoskeletal:     Cervical back: Normal range of motion and neck supple.     Right hip: Tenderness and bony tenderness present. Decreased range of motion. Decreased strength.  Neurological:     Mental Status: She is alert and oriented to person, place, and time.  Psychiatric:        Behavior: Behavior normal.     Vital  signs in last 24 hours:    Labs:   Estimated body mass index is 34.93 kg/m as calculated from the following:   Height as of 04/26/23: 5\' 2"  (1.575 m).   Weight as of 04/26/23: 86.6 kg.   Imaging Review Plain radiographs demonstrate severe degenerative joint disease of the right hip(s). The bone quality appears to be good for age and reported activity level.  Assessment/Plan:  End stage arthritis, right hip(s)  The patient history, physical examination, clinical judgement of the provider and imaging studies are consistent with end stage degenerative joint disease of the right hip(s) and total hip arthroplasty is deemed medically necessary. The treatment options including medical management, injection therapy, arthroscopy and arthroplasty were discussed at length. The risks and benefits of total hip arthroplasty were presented and reviewed. The risks due to aseptic loosening, infection, stiffness, dislocation/subluxation,  thromboembolic complications and other imponderables were discussed.  The patient acknowledged the explanation, agreed to proceed with the plan and consent was signed. Patient is being admitted for inpatient treatment for surgery, pain control, PT, OT, prophylactic antibiotics, VTE prophylaxis, progressive ambulation and ADL's and discharge planning.The patient is planning to be discharged home with home health services

## 2023-05-04 NOTE — Telephone Encounter (Signed)
Attempted call to patient to review pre-op information for surgery. Left VM on home number as well as mobile number that seems to be patient's daughter- who is listed as contact on chart. Will re-try later in the day if don't receive a call back.

## 2023-05-05 ENCOUNTER — Encounter (HOSPITAL_COMMUNITY)
Admission: RE | Disposition: A | Discharge status: Home Health | Payer: Self-pay | Source: Home / Self Care | Attending: Orthopaedic Surgery

## 2023-05-05 ENCOUNTER — Ambulatory Visit (HOSPITAL_COMMUNITY): Payer: Medicare PPO | Admitting: Certified Registered"

## 2023-05-05 ENCOUNTER — Ambulatory Visit (HOSPITAL_COMMUNITY): Payer: Medicare PPO

## 2023-05-05 ENCOUNTER — Other Ambulatory Visit: Payer: Self-pay

## 2023-05-05 ENCOUNTER — Ambulatory Visit (HOSPITAL_BASED_OUTPATIENT_CLINIC_OR_DEPARTMENT_OTHER): Payer: Medicare PPO | Admitting: Certified Registered"

## 2023-05-05 ENCOUNTER — Observation Stay (HOSPITAL_COMMUNITY)
Admission: RE | Admit: 2023-05-05 | Discharge: 2023-05-07 | Disposition: A | Discharge status: Home Health | Payer: Medicare PPO | Attending: Orthopaedic Surgery | Admitting: Orthopaedic Surgery

## 2023-05-05 ENCOUNTER — Encounter (HOSPITAL_COMMUNITY): Payer: Self-pay | Admitting: Orthopaedic Surgery

## 2023-05-05 ENCOUNTER — Observation Stay (HOSPITAL_COMMUNITY): Payer: Medicare PPO

## 2023-05-05 DIAGNOSIS — E119 Type 2 diabetes mellitus without complications: Secondary | ICD-10-CM | POA: Insufficient documentation

## 2023-05-05 DIAGNOSIS — Z7984 Long term (current) use of oral hypoglycemic drugs: Secondary | ICD-10-CM | POA: Insufficient documentation

## 2023-05-05 DIAGNOSIS — Z79899 Other long term (current) drug therapy: Secondary | ICD-10-CM | POA: Insufficient documentation

## 2023-05-05 DIAGNOSIS — Z96642 Presence of left artificial hip joint: Secondary | ICD-10-CM

## 2023-05-05 DIAGNOSIS — Z96641 Presence of right artificial hip joint: Secondary | ICD-10-CM | POA: Diagnosis not present

## 2023-05-05 DIAGNOSIS — M1611 Unilateral primary osteoarthritis, right hip: Principal | ICD-10-CM

## 2023-05-05 DIAGNOSIS — Z471 Aftercare following joint replacement surgery: Secondary | ICD-10-CM | POA: Diagnosis not present

## 2023-05-05 DIAGNOSIS — I10 Essential (primary) hypertension: Secondary | ICD-10-CM

## 2023-05-05 DIAGNOSIS — Z794 Long term (current) use of insulin: Secondary | ICD-10-CM

## 2023-05-05 HISTORY — PX: TOTAL HIP ARTHROPLASTY: SHX124

## 2023-05-05 LAB — GLUCOSE, CAPILLARY
Glucose-Capillary: 96 mg/dL (ref 70–99)
Glucose-Capillary: 74 mg/dL (ref 70–99)
Glucose-Capillary: 163 mg/dL — ABNORMAL HIGH (ref 70–99)
Glucose-Capillary: 50 mg/dL — ABNORMAL LOW (ref 70–99)

## 2023-05-05 LAB — TYPE AND SCREEN

## 2023-05-05 LAB — ABO/RH: ABO/RH(D): O POS

## 2023-05-05 SURGERY — ARTHROPLASTY, HIP, TOTAL, ANTERIOR APPROACH
Anesthesia: Spinal | Site: Hip | Laterality: Right

## 2023-05-05 MED ORDER — PHENYLEPHRINE HCL-NACL 20-0.9 MG/250ML-% IV SOLN
INTRAVENOUS | Status: DC | PRN
  Administered 2023-05-05: 20 ug/min via INTRAVENOUS

## 2023-05-05 MED ORDER — VITAMIN C 500 MG PO TABS
500.0000 mg | ORAL_TABLET | Freq: Every day | ORAL | Status: DC
  Administered 2023-05-06 – 2023-05-07 (×2): 500 mg via ORAL
  Filled 2023-05-05 (×2): qty 1

## 2023-05-05 MED ORDER — OXYCODONE HCL 5 MG PO TABS: 5.0000 mg | ORAL_TABLET | Freq: Once | ORAL | Status: DC | PRN

## 2023-05-05 MED ORDER — METFORMIN HCL 500 MG PO TABS
500.0000 mg | ORAL_TABLET | Freq: Three times a day (TID) | ORAL | Status: DC
  Administered 2023-05-06 – 2023-05-07 (×3): 500 mg via ORAL
  Filled 2023-05-05 (×3): qty 1

## 2023-05-05 MED ORDER — DEXTROSE 50 % IV SOLN
1.0000 | Freq: Once | INTRAVENOUS | Status: AC
  Administered 2023-05-05: 50 mL via INTRAVENOUS

## 2023-05-05 MED ORDER — INSULIN GLARGINE-YFGN 100 UNIT/ML ~~LOC~~ SOLN
10.0000 [IU] | Freq: Every day | SUBCUTANEOUS | Status: DC
  Administered 2023-05-06 – 2023-05-07 (×2): 10 [IU] via SUBCUTANEOUS
  Filled 2023-05-05 (×2): qty 0.1

## 2023-05-05 MED ORDER — HYDROCODONE-ACETAMINOPHEN 5-325 MG PO TABS: 1.0000 | ORAL_TABLET | ORAL | Status: DC | PRN

## 2023-05-05 MED ORDER — HYDROCODONE-ACETAMINOPHEN 7.5-325 MG PO TABS
1.0000 | ORAL_TABLET | ORAL | Status: DC | PRN
  Filled 2023-05-05: qty 1

## 2023-05-05 MED ORDER — ONDANSETRON HCL 4 MG PO TABS: 4.0000 mg | ORAL_TABLET | Freq: Four times a day (QID) | ORAL | Status: DC | PRN

## 2023-05-05 MED ORDER — TRANEXAMIC ACID-NACL 1000-0.7 MG/100ML-% IV SOLN
1000.0000 mg | INTRAVENOUS | Status: AC
  Administered 2023-05-05: 1000 mg via INTRAVENOUS
  Filled 2023-05-05: qty 100

## 2023-05-05 MED ORDER — METHOCARBAMOL 500 MG IVPB - SIMPLE MED
500.0000 mg | Freq: Four times a day (QID) | INTRAVENOUS | Status: DC | PRN
  Administered 2023-05-05: 500 mg via INTRAVENOUS

## 2023-05-05 MED ORDER — ASPIRIN 81 MG PO CHEW
81.0000 mg | CHEWABLE_TABLET | Freq: Two times a day (BID) | ORAL | Status: DC
  Administered 2023-05-05 – 2023-05-07 (×4): 81 mg via ORAL
  Filled 2023-05-05 (×4): qty 1

## 2023-05-05 MED ORDER — SODIUM CHLORIDE 0.9 % IV SOLN: 12.5000 mg | Freq: Four times a day (QID) | INTRAVENOUS | Status: DC | PRN

## 2023-05-05 MED ORDER — POLYVINYL ALCOHOL 1.4 % OP SOLN
1.0000 [drp] | Freq: Every day | OPHTHALMIC | Status: DC
  Administered 2023-05-05: 1 [drp] via OPHTHALMIC
  Filled 2023-05-05: qty 15

## 2023-05-05 MED ORDER — MORPHINE SULFATE (PF) 2 MG/ML IV SOLN: 0.5000 mg | INTRAVENOUS | Status: DC | PRN

## 2023-05-05 MED ORDER — STERILE WATER FOR IRRIGATION IR SOLN
Status: DC | PRN
  Administered 2023-05-05: 2000 mL

## 2023-05-05 MED ORDER — 0.9 % SODIUM CHLORIDE (POUR BTL) OPTIME
TOPICAL | Status: DC | PRN
  Administered 2023-05-05: 1000 mL

## 2023-05-05 MED ORDER — CEFAZOLIN SODIUM-DEXTROSE 2-4 GM/100ML-% IV SOLN
2.0000 g | INTRAVENOUS | Status: AC
  Administered 2023-05-05: 2 g via INTRAVENOUS
  Filled 2023-05-05: qty 100

## 2023-05-05 MED ORDER — ONDANSETRON HCL 4 MG/2ML IJ SOLN
INTRAMUSCULAR | Status: DC | PRN
  Administered 2023-05-05: 4 mg via INTRAVENOUS

## 2023-05-05 MED ORDER — INSULIN ASPART 100 UNIT/ML IJ SOLN: 0.0000 [IU] | INTRAMUSCULAR | Status: DC | PRN

## 2023-05-05 MED ORDER — LACTATED RINGERS IV SOLN: INTRAVENOUS | Status: DC

## 2023-05-05 MED ORDER — ALBUMIN HUMAN 5 % IV SOLN
INTRAVENOUS | Status: AC
  Filled 2023-05-05: qty 250

## 2023-05-05 MED ORDER — ALUM & MAG HYDROXIDE-SIMETH 200-200-20 MG/5ML PO SUSP
30.0000 mL | ORAL | Status: DC | PRN
  Filled 2023-05-05: qty 30

## 2023-05-05 MED ORDER — ALBUMIN HUMAN 5 % IV SOLN
12.5000 g | Freq: Once | INTRAVENOUS | Status: AC
  Administered 2023-05-05: 12.5 g via INTRAVENOUS

## 2023-05-05 MED ORDER — BUPIVACAINE IN DEXTROSE 0.75-8.25 % IT SOLN
INTRATHECAL | Status: DC | PRN
  Administered 2023-05-05: 1.6 mL via INTRATHECAL

## 2023-05-05 MED ORDER — SODIUM CHLORIDE 0.9 % IR SOLN
Status: DC | PRN
  Administered 2023-05-05: 1000 mL

## 2023-05-05 MED ORDER — ACETAMINOPHEN 10 MG/ML IV SOLN
1000.0000 mg | Freq: Once | INTRAVENOUS | Status: DC | PRN
  Administered 2023-05-05: 1000 mg via INTRAVENOUS

## 2023-05-05 MED ORDER — ACETAMINOPHEN 10 MG/ML IV SOLN
INTRAVENOUS | Status: AC
  Filled 2023-05-05: qty 100

## 2023-05-05 MED ORDER — MENTHOL 3 MG MT LOZG: 1.0000 | LOZENGE | OROMUCOSAL | Status: DC | PRN

## 2023-05-05 MED ORDER — CO Q-10 200 MG PO CAPS: 200.0000 mg | ORAL_CAPSULE | Freq: Every day | ORAL | Status: DC

## 2023-05-05 MED ORDER — DOCUSATE SODIUM 100 MG PO CAPS
100.0000 mg | ORAL_CAPSULE | Freq: Two times a day (BID) | ORAL | Status: DC
  Administered 2023-05-05 – 2023-05-07 (×4): 100 mg via ORAL
  Filled 2023-05-05 (×4): qty 1

## 2023-05-05 MED ORDER — VITAMIN D3 25 MCG (1000 UNIT) PO TABS
4000.0000 [IU] | ORAL_TABLET | Freq: Every day | ORAL | Status: DC
  Administered 2023-05-06 – 2023-05-07 (×2): 4000 [IU] via ORAL
  Filled 2023-05-05 (×4): qty 4

## 2023-05-05 MED ORDER — DEXAMETHASONE SODIUM PHOSPHATE 4 MG/ML IJ SOLN
INTRAMUSCULAR | Status: DC | PRN
  Administered 2023-05-05: 8 mg via INTRAVENOUS

## 2023-05-05 MED ORDER — METHOCARBAMOL 500 MG IVPB - SIMPLE MED
INTRAVENOUS | Status: AC
  Filled 2023-05-05: qty 55

## 2023-05-05 MED ORDER — INSULIN DEGLUDEC 200 UNIT/ML ~~LOC~~ SOPN: PEN_INJECTOR | Freq: Every day | SUBCUTANEOUS | Status: DC

## 2023-05-05 MED ORDER — CHLORHEXIDINE GLUCONATE 0.12 % MT SOLN
15.0000 mL | Freq: Once | OROMUCOSAL | Status: AC
  Administered 2023-05-05: 15 mL via OROMUCOSAL

## 2023-05-05 MED ORDER — FENTANYL CITRATE PF 50 MCG/ML IJ SOSY: 25.0000 ug | PREFILLED_SYRINGE | INTRAMUSCULAR | Status: DC | PRN

## 2023-05-05 MED ORDER — LIDOCAINE 2% (20 MG/ML) 5 ML SYRINGE
INTRAMUSCULAR | Status: DC | PRN
  Administered 2023-05-05: 80 mg via INTRAVENOUS

## 2023-05-05 MED ORDER — ONDANSETRON HCL 4 MG/2ML IJ SOLN: 4.0000 mg | Freq: Once | INTRAMUSCULAR | Status: DC | PRN

## 2023-05-05 MED ORDER — PANTOPRAZOLE SODIUM 40 MG PO TBEC
40.0000 mg | DELAYED_RELEASE_TABLET | Freq: Every day | ORAL | Status: DC
  Administered 2023-05-05: 40 mg via ORAL
  Filled 2023-05-05 (×2): qty 1

## 2023-05-05 MED ORDER — PHENOL 1.4 % MT LIQD: 1.0000 | OROMUCOSAL | Status: DC | PRN

## 2023-05-05 MED ORDER — ORAL CARE MOUTH RINSE: 15.0000 mL | Freq: Once | OROMUCOSAL | Status: AC

## 2023-05-05 MED ORDER — METHOCARBAMOL 500 MG PO TABS
500.0000 mg | ORAL_TABLET | Freq: Four times a day (QID) | ORAL | Status: DC | PRN
  Administered 2023-05-06: 500 mg via ORAL
  Filled 2023-05-05: qty 1

## 2023-05-05 MED ORDER — DEXTROSE 50 % IV SOLN
INTRAVENOUS | Status: AC
  Filled 2023-05-05: qty 50

## 2023-05-05 MED ORDER — METOCLOPRAMIDE HCL 5 MG PO TABS: 5.0000 mg | ORAL_TABLET | Freq: Three times a day (TID) | ORAL | Status: DC | PRN

## 2023-05-05 MED ORDER — RESVERATROL 100 MG PO CAPS: 100.0000 mg | ORAL_CAPSULE | Freq: Every day | ORAL | Status: DC

## 2023-05-05 MED ORDER — SODIUM CHLORIDE 0.9 % IV SOLN: INTRAVENOUS | Status: DC

## 2023-05-05 MED ORDER — PHENYLEPHRINE 80 MCG/ML (10ML) SYRINGE FOR IV PUSH (FOR BLOOD PRESSURE SUPPORT)
PREFILLED_SYRINGE | INTRAVENOUS | Status: DC | PRN
  Administered 2023-05-05 (×2): 80 ug via INTRAVENOUS

## 2023-05-05 MED ORDER — CEFAZOLIN SODIUM-DEXTROSE 1-4 GM/50ML-% IV SOLN
1.0000 g | Freq: Four times a day (QID) | INTRAVENOUS | Status: AC
  Administered 2023-05-05 – 2023-05-06 (×2): 1 g via INTRAVENOUS
  Filled 2023-05-05 (×2): qty 50

## 2023-05-05 MED ORDER — POVIDONE-IODINE 10 % EX SWAB: 2.0000 | Freq: Once | CUTANEOUS | Status: DC

## 2023-05-05 MED ORDER — TRAMADOL HCL 50 MG PO TABS
50.0000 mg | ORAL_TABLET | Freq: Four times a day (QID) | ORAL | Status: DC | PRN
  Administered 2023-05-05 – 2023-05-07 (×4): 50 mg via ORAL
  Filled 2023-05-05 (×2): qty 1
  Filled 2023-05-05: qty 2
  Filled 2023-05-05: qty 1

## 2023-05-05 MED ORDER — ONDANSETRON HCL 4 MG/2ML IJ SOLN: 4.0000 mg | Freq: Four times a day (QID) | INTRAMUSCULAR | Status: DC | PRN

## 2023-05-05 MED ORDER — ACETAMINOPHEN 325 MG PO TABS
325.0000 mg | ORAL_TABLET | Freq: Four times a day (QID) | ORAL | Status: DC | PRN
  Administered 2023-05-06: 650 mg via ORAL
  Filled 2023-05-05: qty 2

## 2023-05-05 MED ORDER — METOCLOPRAMIDE HCL 5 MG/ML IJ SOLN
5.0000 mg | Freq: Three times a day (TID) | INTRAMUSCULAR | Status: DC | PRN
  Administered 2023-05-05: 10 mg via INTRAVENOUS
  Filled 2023-05-05: qty 2

## 2023-05-05 MED ORDER — PROPOFOL 500 MG/50ML IV EMUL
INTRAVENOUS | Status: DC | PRN
  Administered 2023-05-05: 20 ug/kg/min via INTRAVENOUS

## 2023-05-05 MED ORDER — OXYCODONE HCL 5 MG/5ML PO SOLN: 5.0000 mg | Freq: Once | ORAL | Status: DC | PRN

## 2023-05-05 MED ORDER — DIPHENHYDRAMINE HCL 12.5 MG/5ML PO ELIX: 12.5000 mg | ORAL_SOLUTION | ORAL | Status: DC | PRN

## 2023-05-05 SURGICAL SUPPLY — 42 items
APL SKNCLS NONHYPOALLERGENIC (GAUZE/BANDAGES/DRESSINGS)
APL SKNCLS STERI-STRIP NONHPOA (GAUZE/BANDAGES/DRESSINGS)
BAG COUNTER SPONGE SURGICOUNT (BAG) ×2 IMPLANT
BAG SPEC THK2 15X12 ZIP CLS (MISCELLANEOUS)
BAG SPNG CNTER NS LX DISP (BAG) ×1
BAG ZIPLOCK 12X15 (MISCELLANEOUS) IMPLANT
BENZOIN TINCTURE PRP APPL 2/3 (GAUZE/BANDAGES/DRESSINGS) IMPLANT
BLADE SAW SGTL 18X1.27X75 (BLADE) ×2 IMPLANT
COVER PERINEAL POST (MISCELLANEOUS) ×2 IMPLANT
COVER SURGICAL LIGHT HANDLE (MISCELLANEOUS) ×2 IMPLANT
CUP SECTOR GRIPTON 50MM (Cup) IMPLANT
DRAPE FOOT SWITCH (DRAPES) ×2 IMPLANT
DRAPE STERI IOBAN 125X83 (DRAPES) ×2 IMPLANT
DRAPE U-SHAPE 47X51 STRL (DRAPES) ×4 IMPLANT
DRSG AQUACEL AG ADV 3.5X10 (GAUZE/BANDAGES/DRESSINGS) ×2 IMPLANT
DURAPREP 26ML APPLICATOR (WOUND CARE) ×2 IMPLANT
ELECT REM PT RETURN 15FT ADLT (MISCELLANEOUS) ×2 IMPLANT
GAUZE XEROFORM 1X8 LF (GAUZE/BANDAGES/DRESSINGS) IMPLANT
GLOVE BIO SURGEON STRL SZ7.5 (GLOVE) ×2 IMPLANT
GLOVE BIOGEL PI IND STRL 8 (GLOVE) ×4 IMPLANT
GLOVE ECLIPSE 8.0 STRL XLNG CF (GLOVE) ×2 IMPLANT
GOWN STRL REUS W/ TWL XL LVL3 (GOWN DISPOSABLE) ×4 IMPLANT
GOWN STRL REUS W/TWL XL LVL3 (GOWN DISPOSABLE) ×2
HANDPIECE INTERPULSE COAX TIP (DISPOSABLE) ×1
HEAD FEM STD 32X+1 STRL (Hips) IMPLANT
HOLDER FOLEY CATH W/STRAP (MISCELLANEOUS) ×2 IMPLANT
KIT TURNOVER KIT A (KITS) IMPLANT
LINER ACETABULAR 32X50 (Liner) IMPLANT
PACK ANTERIOR HIP CUSTOM (KITS) ×2 IMPLANT
SET HNDPC FAN SPRY TIP SCT (DISPOSABLE) ×2 IMPLANT
STAPLER VISISTAT 35W (STAPLE) IMPLANT
STEM CORAIL KA12 (Stem) IMPLANT
STRIP CLOSURE SKIN 1/2X4 (GAUZE/BANDAGES/DRESSINGS) IMPLANT
SUT ETHIBOND NAB CT1 #1 30IN (SUTURE) ×2 IMPLANT
SUT ETHILON 2 0 PS N (SUTURE) IMPLANT
SUT MNCRL AB 4-0 PS2 18 (SUTURE) IMPLANT
SUT VIC AB 0 CT1 36 (SUTURE) ×2 IMPLANT
SUT VIC AB 1 CT1 36 (SUTURE) ×2 IMPLANT
SUT VIC AB 2-0 CT1 27 (SUTURE) ×2
SUT VIC AB 2-0 CT1 TAPERPNT 27 (SUTURE) ×4 IMPLANT
TRAY FOLEY MTR SLVR 16FR STAT (SET/KITS/TRAYS/PACK) IMPLANT
YANKAUER SUCT BULB TIP NO VENT (SUCTIONS) ×2 IMPLANT

## 2023-05-05 NOTE — Op Note (Signed)
Operative Note  Date of operation: 05/05/2023 Preoperative diagnosis: Right hip primary osteoarthritis Postoperative diagnosis: Same  Procedure: Right direct anterior total hip arthroplasty  Implants: Implant Name Type Inv. Item Serial No. Manufacturer Lot No. LRB No. Used Action  LINER ACETABULAR 32X50 - [id number] Liner LINER ACETABULAR 32X50  DEPU[employer]TH[employer] [id number] Right 1 Implanted  CUP SECTOR GRIPTON - [id number] Cup CUP SECTOR GRIPTON  DEPUY ORTHOPAED[employer] [id number] Right 1 Implanted  HEAD FEM STD 32X+1 STRL - [id number] Hips HEAD FEM STD 32X+1 STRL  DEPUY ORTHOPA[employer] [id number] Right 1 Implanted  STEM CORAIL KA12 - [id number] Stem STEM Verta Ellen  DEPUY [employer]TH[employer] [id number] Right 1 Implanted   Surgeon: Vanita Panda. Magnus Ivan, MD Assistant: Tomma Lightning, RNFA  Anesthesia: Spinal EBL: 150 cc Antibiotics: IV Ancef Complications: None  Indications: The patient is an 82 year old female well-known to me.  We actually replaced her left hip in 2016.  She has well-documented severe arthritis in her right hip it has gotten worse over the last several months.  At this point it is detrimentally affecting her mobility, her quality of life, and her activities of daily living to the point she wished to proceed with a total hip arthroplasty on the right side.  She is fully aware of the risk and benefits of the surgery including the risk of acute blood loss anemia, nerve vessel injury, fracture, infection, dislocation, DVT, implant failure, leg length differences and wound healing issues.  She understands her goals are hopefully decrease pain, improve mobility, and improve quality of life.  Procedure description: After informed consent was obtained and the appropriate right hip was marked, the patient was brought to the operating room and set up on the stretcher where spinal anesthesia was obtained.  She was then laid in supine position on the stretcher and a Foley catheter was placed.   Traction boots were placed on both her feet and she was placed supine on the Hana fracture table with a perineal post in place in both legs and inline skeletal traction devices but no traction applied.  I then assessed her right hip and pelvis radiographically.  Clinically she is much shorter on the right side than the left but the x-rays make it look like she is slightly longer so we will modify that and compensate that for our case.  The right hip was then prepped and draped with DuraPrep and sterile drapes.  A timeout was called he was identified as the correct patient the correct right hip.  An incision was made just inferior and posterior the ASIS and carried slightly obliquely down the leg.  The dissection was carried down to the tensor fascia lata muscle and tensor fascia was then divided longitudinally to proceed with a direct interposed the hip.  Circumflex vessels were identified and cauterized.  The hip capsule identified and opened up in L-type format finding a moderate joint effusion.  Cobra retractors were placed around the medial and lateral femoral neck and a femoral neck cut was made with an oscillating saw just proximal to the lesser trochanter.  This Was completed with an osteotome.  A corkscrew guide was placed in the femoral head and the femoral head was removed in its entirety and there was a wide area devoid of cartilage.  A bent Hohmann was then placed over the medial acetabular rim and remnants of the acetabular labrum and other debris removed.  Reaming was then initiated from a size 43 reamer and stepwise increments going up to a size  49 reamer with all reamers placed under direct visualization and the last reamer placed under direct fluoroscopy in order to obtain the depth of reaming, the inclination and the anteversion.  The real DePuy sector GRIPTION acetabular component size 50 was then placed without difficulty followed by a 32+0 neutral polythene liner.  Attention was then turned to  the femur.  With the right leg externally rotated to 120 degrees, extended and adducted, a Mueller retractor was placed medially and a Hohmann retractor behind the greater trochanter.  The lateral joint capsule was released and a box cutting osteotome was used to enter the femoral canal.  Broaching was then initiated using the Corail broaching system from a size 8 going up to a size 12.  With a size 12 broach in place we trialed a standard offset femoral neck and a 32+1 trial hip ball.  The leg was brought over and up and with traction and internal rotation reduced in the pelvis.  We are pleased with leg length, offset, range of motion and stability assessed mechanically and radiographically.  We then dislocated the hip and remove the trial components.  We placed the real size 12 femoral component with standard offset and the real 32+1 metal head ball.  Again this was reduced in the acetabulum and we are pleased with range of motion, stability, leg length and offset assessed again radiographically and mechanically.  The soft tissue was then irrigated with normal saline solution.  Remnants of the joint capsule were closed with interrupted #1 Ethibond suture followed by a running #1 Vicryl to close the tensor fascia.  0 Vicryl was used to close deep tissue and 2-0 Vicryl was used to close subcutaneous tissue.  The skin was closed with staples.  Well-padded sterile dressing was applied.  She was taken the recovery room in stable condition.

## 2023-05-05 NOTE — Interval H&P Note (Signed)
History and Physical Interval Note: The patient understands that she is here today for a right total hip replacement to treat her severe right hip arthritis.  There has been no acute or interval change in her medical status.  The risks and benefits of surgery have been discussed in detail and informed consent has been obtained.  The right operative hip has been marked.  05/05/2023 9:55 AM  Robin Vaughn  has presented today for surgery, with the diagnosis of OSTEOARTHRITIS RIGHT HIP.  The various methods of treatment have been discussed with the patient and family. After consideration of risks, benefits and other options for treatment, the patient has consented to  Procedure(s): RIGHT TOTAL HIP ARTHROPLASTY ANTERIOR APPROACH (Right) as a surgical intervention.  The patient's history has been reviewed, patient examined, no change in status, stable for surgery.  I have reviewed the patient's chart and labs.  Questions were answered to the patient's satisfaction.     [name]

## 2023-05-05 NOTE — Anesthesia Procedure Notes (Signed)
Spinal  Start time: 05/05/2023 11:24 AM End time: 05/05/2023 11:29 AM Reason for block: surgical anesthesia Staffing Performed: resident/CRNA  Resident/CRNA: Elisabeth Cara, CRNA Performed by: Elisabeth Cara, CRNA Authorized by: Elisabeth Cara, CRNA   Preanesthetic Checklist Completed: patient identified, IV checked, site marked, risks and benefits discussed, surgical consent, monitors and equipment checked, pre-op evaluation and timeout performed Spinal Block Patient position: sitting Prep: DuraPrep and site prepped and draped Patient monitoring: heart rate, cardiac monitor, continuous pulse ox and blood pressure Approach: midline Location: L3-4 Needle Needle type: Pencan  Needle gauge: 24 G Needle length: 10 cm Assessment Sensory level: T4 Events: CSF return Additional Notes Pt placed in sitting position for spinal placement. Spinal kit expiration date checked and verified. Placed in sitting position. Sterile prep and drape of back. Local applied to site. Two attempts by CRNA. + clear free flowing CSF obtained. - heme. Pt tolerated the procedure well and placed supine after placement

## 2023-05-05 NOTE — Anesthesia Preprocedure Evaluation (Signed)
Anesthesia Evaluation  Patient identified by MRN, date of birth, ID band Patient awake    Reviewed: Allergy & Precautions, H&P , NPO status , Patient's Chart, lab work & pertinent test results  Airway Mallampati: II  TM Distance: >3 FB Neck ROM: Full    Dental  (+) Upper Dentures, Partial Lower   Pulmonary neg pulmonary ROS   Pulmonary exam normal breath sounds clear to auscultation       Cardiovascular hypertension, Pt. on medications Normal cardiovascular exam Rhythm:Regular Rate:Normal     Neuro/Psych negative neurological ROS  negative psych ROS   GI/Hepatic Neg liver ROS,GERD  ,,  Endo/Other  diabetes    Renal/GU negative Renal ROS  negative genitourinary   Musculoskeletal  (+) Arthritis , Osteoarthritis,    Abdominal   Peds negative pediatric ROS (+)  Hematology negative hematology ROS (+)   Anesthesia Other Findings   Reproductive/Obstetrics negative OB ROS                             Anesthesia Physical Anesthesia Plan  ASA: 2  Anesthesia Plan: Spinal   Post-op Pain Management: Regional block*   Induction: Intravenous  PONV Risk Score and Plan: 2 and Ondansetron, Propofol infusion and Treatment may vary due to age or medical condition  Airway Management Planned: Simple Face Mask  Additional Equipment:   Intra-op Plan:   Post-operative Plan:   Informed Consent: I have reviewed the patients History and Physical, chart, labs and discussed the procedure including the risks, benefits and alternatives for the proposed anesthesia with the patient or authorized representative who has indicated his/her understanding and acceptance.     Dental advisory given  Plan Discussed with: CRNA and Surgeon  Anesthesia Plan Comments:        Anesthesia Quick Evaluation

## 2023-05-05 NOTE — Care Plan (Signed)
RNCM spoke with patient prior to surgery and reviewed post op care instructions.Have spoken with patient about being a [facility] Ortho bundle for her Total hip replacement on 05/05/23. Anticipate HHPT will be needed after a short hospital stay. Referral made to [facility] after choice provided. Will continue to follow for needs.

## 2023-05-05 NOTE — Anesthesia Procedure Notes (Signed)
Procedure Name: MAC Date/Time: 05/05/2023 11:21 AM  Performed by: Elisabeth Cara, CRNAPre-anesthesia Checklist: Patient identified, Emergency Drugs available, Suction available, Patient being monitored and Timeout performed Patient Re-evaluated:Patient Re-evaluated prior to induction Oxygen Delivery Method: Simple face mask Placement Confirmation: CO2 detector and breath sounds checked- equal and bilateral Dental Injury: Teeth and Oropharynx as per pre-operative assessment

## 2023-05-05 NOTE — Anesthesia Postprocedure Evaluation (Signed)
Anesthesia Post Note  Patient: Robin Vaughn  Procedure(s) Performed: RIGHT TOTAL HIP ARTHROPLASTY ANTERIOR APPROACH (Right: Hip)     Patient location during evaluation: PACU Anesthesia Type: Spinal Level of consciousness: oriented and awake and alert Pain management: pain level controlled Vital Signs Assessment: post-procedure vital signs reviewed and stable Respiratory status: spontaneous breathing, respiratory function stable and patient connected to nasal cannula oxygen Cardiovascular status: blood pressure returned to baseline and stable Postop Assessment: no headache, no backache and no apparent nausea or vomiting Anesthetic complications: no  No notable events documented.  Last Vitals:  Vitals:   05/05/23 1530 05/05/23 1553  BP: 105/74 112/61  Pulse: 88 78  Resp: 19 20  Temp: 36.7 C 36.7 C  SpO2: 99% 94%    Last Pain:  Vitals:   05/05/23 1553  TempSrc: Oral  PainSc: 2                  Eastin Swing S

## 2023-05-05 NOTE — Evaluation (Signed)
Physical Therapy Evaluation Patient Details Name: Robin Vaughn MRN: [id number] DOB: 1941/06/20 Today's Date: 05/05/2023  History of Present Illness  82 yo female presents to therapy s/p R THA, anterior approach on 05/05/2023 due to failure of conservative measures. During recover in PACU blood pressure of 74/36. Blood pressure was rechecked and read 79/56 and albumin infusion was ordered and given. Pt blood glucose levels deceased to 50 mgDL, pt symptomatic and dextrose injection administered. Pt PMH includes but is not limited to: HTN, DOE, angina, L THA, DM II, GERD, IBS, hiatal hernia and L shoulder surgery.  Clinical Impression   Robin Vaughn is a 82 y.o. female POD 0 s/p R THA. Patient reports IND with mobility at baseline. Patient is now limited by functional impairments (see PT problem list below) and pt seated in recliner when PT arrived reported staff assist for bed mobility and min A for transfers. Patient was able to ambulate 20 feet with RW and min guard level of assist. Patient instructed in exercise to facilitate ROM and circulation to manage edema.  Patient will benefit from continued skilled PT interventions to address impairments and progress towards PLOF. Acute PT will follow to progress mobility and stair training in preparation for safe discharge to daughters home with HHPT services.       Recommendations for follow up therapy are one component of a multi-disciplinary discharge planning process, led by the attending physician.  Recommendations may be updated based on patient status, additional functional criteria and insurance authorization.  Follow Up Recommendations       Assistance Recommended at Discharge Intermittent Supervision/Assistance  Patient can return home with the following  A little help with walking and/or transfers;A little help with bathing/dressing/bathroom;Assistance with cooking/housework;Assist for transportation;Help with stairs or ramp for  entrance    Equipment Recommendations Rolling walker (2 wheels)  Recommendations for Other Services       Functional Status Assessment Patient has had a recent decline in their functional status and demonstrates the ability to make significant improvements in function in a reasonable and predictable amount of time.     Precautions / Restrictions Precautions Precautions: Fall Restrictions Weight Bearing Restrictions: No      Mobility  Bed Mobility               General bed mobility comments: pt presented to PT seated in recliner due to discomfort and nausea in bed    Transfers Overall transfer level: Needs assistance Equipment used: Rolling walker (2 wheels) Transfers: Sit to/from Stand Sit to Stand: Min assist           General transfer comment: pt required min A for trunk flexion from recliner and to scoot to edge of recliner, cues for push to stand from recliner and completed STS with min guard. pt required increased time and min A to scoot posteriorly in recliner s/p gait tasks with increased time and cues    Ambulation/Gait Ambulation/Gait assistance: Min guard Gait Distance (Feet): 20 Feet Assistive device: Rolling walker (2 wheels) Gait Pattern/deviations: Step-to pattern, Shuffle, Trunk flexed, Antalgic Gait velocity: decreased     General Gait Details: minimal R foot clearance/scooting forward on floor, pt performing L PF to clear R LE, encouragement and heavy reliance on B UE support  Stairs            Wheelchair Mobility    Modified Rankin (Stroke Patients Only)       Balance Overall balance assessment: Needs assistance Sitting-balance support: Feet supported Sitting  balance-Leahy Scale: Fair     Standing balance support: Bilateral upper extremity supported, During functional activity, Reliant on assistive device for balance Standing balance-Leahy Scale: Poor                               Pertinent Vitals/Pain Pain  Assessment Pain Assessment: 0-10 Pain Score: 3  Pain Location: R hip and leg (pt is offloading R hip in sitting and initally limting R LE WB with standing) Pain Descriptors / Indicators: Aching, Constant, Discomfort, Restless, Pressure, Operative site guarding Pain Intervention(s): Limited activity within patient's tolerance, Monitored during session, Premedicated before session, Patient requesting pain meds-RN notified, Ice applied, Repositioned    Home Living Family/patient expects to be discharged to:: Private residence Living Arrangements: Children Available Help at Discharge: Family Type of Home: House Home Access: Stairs to enter Entrance Stairs-Rails: Right;Left;Can reach both Entrance Stairs-Number of Steps: 2   Home Layout: Two level;Able to live on main level with bedroom/bathroom (pt states she will sleep in a recliner on the first floor of her daughters home.) Home Equipment: None Additional Comments: pt will d/c home with daughter. pt lives alone in a townhome    Prior Function Prior Level of Function : Independent/Modified Independent;Working/employed;Driving             Mobility Comments: IND with all ADLs, self care tasks, driving , IADLs and working for [employer] ADLs Comments: No AD     Hand Dominance        Extremity/Trunk Assessment        Lower Extremity Assessment Lower Extremity Assessment: RLE deficits/detail RLE Deficits / Details: ankle DF/PF 5/5 RLE Sensation: WNL    Cervical / Trunk Assessment Cervical / Trunk Assessment:  (wfl)  Communication   Communication: HOH  Cognition Arousal/Alertness: Awake/alert Behavior During Therapy: WFL for tasks assessed/performed Overall Cognitive Status: Within Functional Limits for tasks assessed                                 General Comments: pt nauseated when PT arrived. pt indicated that due to her hiatal hernia she sometimes feels like food gets suck, nurse provided pt  with mediation to address nausea during eval        General Comments      Exercises Total Joint Exercises Ankle Circles/Pumps: AROM, Both, 20 reps   Assessment/Plan    PT Assessment Patient needs continued PT services  PT Problem List Decreased strength;Decreased range of motion;Decreased activity tolerance;Decreased balance;Decreased mobility;Decreased coordination;Pain       PT Treatment Interventions DME instruction;Gait training;Stair training;Functional mobility training;Therapeutic activities;Therapeutic exercise;Balance training;Neuromuscular re-education;Patient/family education;Modalities    PT Goals (Current goals can be found in the Care Plan section)  Acute Rehab PT Goals Patient Stated Goal: to be able to walk my daughters dogs and increase walking mins to 150 a wk and no R hip pain PT Goal Formulation: With patient Time For Goal Achievement: 05/19/23 Potential to Achieve Goals: Good    Frequency 7X/week     Co-evaluation               AM-PAC PT "6 Clicks" Mobility  Outcome Measure Help needed turning from your back to your side while in a flat bed without using bedrails?: A Little Help needed moving from lying on your back to sitting on the side of a flat bed without using bedrails?: A  Little Help needed moving to and from a bed to a chair (including a wheelchair)?: A Little Help needed standing up from a chair using your arms (e.g., wheelchair or bedside chair)?: A Little Help needed to walk in hospital room?: A Little Help needed climbing 3-5 steps with a railing? : Total 6 Click Score: 16    End of Session Equipment Utilized During Treatment: Gait belt Activity Tolerance: Patient limited by pain Patient left: in chair;with call bell/phone within reach;with nursing/sitter in room Nurse Communication: Mobility status;Patient requests pain meds PT Visit Diagnosis: Unsteadiness on feet (R26.81);Other abnormalities of gait and mobility (R26.89);Muscle  weakness (generalized) (M62.81);Pain Pain - Right/Left: Right Pain - part of body: Leg;Hip    Time: 1740-1806 PT Time Calculation (min) (ACUTE ONLY): 26 min   Charges:   PT Evaluation $PT Eval Low Complexity: 1 Low PT Treatments $Gait Training: 8-22 mins         Johnny Bridge, PT Acute Rehab   Jacqualyn Posey 05/05/2023, 6:19 PM

## 2023-05-05 NOTE — Progress Notes (Signed)
Rose MD was notified of patients blood pressure of 74/36. Blood pressure was rechecked and read 79/56. Albumin infusion was ordered and given. Patients current blood pressure is now 104/61. Will continue to monitor.

## 2023-05-05 NOTE — Transfer of Care (Signed)
Immediate Anesthesia Transfer of Care Note  Patient: Robin Vaughn  Procedure(s) Performed: RIGHT TOTAL HIP ARTHROPLASTY ANTERIOR APPROACH (Right: Hip)  Patient Location: PACU  Anesthesia Type:MAC combined with regional for post-op pain  Level of Consciousness: awake and alert   Airway & Oxygen Therapy: Patient Spontanous Breathing and Patient connected to nasal cannula oxygen  Post-op Assessment: Report given to RN and Post -op Vital signs reviewed and stable  Post vital signs: Reviewed and stable  Last Vitals:  Vitals Value Taken Time  BP 97/53 05/05/23 1306  Temp    Pulse 83 05/05/23 1307  Resp 14 05/05/23 1307  SpO2 100 % 05/05/23 1307  Vitals shown include unvalidated device data.  Last Pain:  Vitals:   05/05/23 0903  TempSrc: Oral         Complications: No notable events documented.

## 2023-05-05 NOTE — Progress Notes (Signed)
Hypoglycemic Event  CBG: 50  Treatment: D50 50 mL (25 gm)  Symptoms: Pale and Shaky  Follow-up CBG: Time: 1358 CBG Result: 163  Possible Reasons for Event: Inadequate meal intake  Comments/MD notified: Rose MD notified of patients blood sugar of 50, MD ordered 50% Dextrose injection 1 ample to be given. Patient received Dextrose 50 % 1 ample at 1344. Patient is no longer symptomatic at 1358.     Marca Ancona

## 2023-05-06 DIAGNOSIS — Z7984 Long term (current) use of oral hypoglycemic drugs: Secondary | ICD-10-CM | POA: Diagnosis not present

## 2023-05-06 DIAGNOSIS — I10 Essential (primary) hypertension: Secondary | ICD-10-CM | POA: Diagnosis not present

## 2023-05-06 DIAGNOSIS — Z96642 Presence of left artificial hip joint: Secondary | ICD-10-CM | POA: Diagnosis not present

## 2023-05-06 DIAGNOSIS — E119 Type 2 diabetes mellitus without complications: Secondary | ICD-10-CM | POA: Diagnosis not present

## 2023-05-06 DIAGNOSIS — Z79899 Other long term (current) drug therapy: Secondary | ICD-10-CM | POA: Diagnosis not present

## 2023-05-06 DIAGNOSIS — M1611 Unilateral primary osteoarthritis, right hip: Secondary | ICD-10-CM | POA: Diagnosis not present

## 2023-05-06 DIAGNOSIS — R531 Weakness: Secondary | ICD-10-CM | POA: Diagnosis not present

## 2023-05-06 LAB — CBC
HCT: 31.6 % — ABNORMAL LOW (ref 36.0–46.0)
Hemoglobin: 10.1 g/dL — ABNORMAL LOW (ref 12.0–15.0)
MCH: 28.5 pg (ref 26.0–34.0)
MCHC: 32 g/dL (ref 30.0–36.0)
MCV: 89 fL (ref 80.0–100.0)
Platelets: 141 10*3/uL — ABNORMAL LOW (ref 150–400)
RBC: 3.55 MIL/uL — ABNORMAL LOW (ref 3.87–5.11)
RDW: 14 % (ref 11.5–15.5)
WBC: 10 10*3/uL (ref 4.0–10.5)
nRBC: 0 % (ref 0.0–0.2)

## 2023-05-06 LAB — BASIC METABOLIC PANEL
Anion gap: 6 (ref 5–15)
BUN: 16 mg/dL (ref 8–23)
CO2: 22 mmol/L (ref 22–32)
Calcium: 8.4 mg/dL — ABNORMAL LOW (ref 8.9–10.3)
Chloride: 105 mmol/L (ref 98–111)
Creatinine, Ser: 0.78 mg/dL (ref 0.44–1.00)
GFR, Estimated: >60 mL/min (ref >60)
Glucose, Bld: 189 mg/dL — ABNORMAL HIGH (ref 70–99)
Potassium: 4.9 mmol/L (ref 3.5–5.1)
Sodium: 133 mmol/L — ABNORMAL LOW (ref 135–145)

## 2023-05-06 LAB — GLUCOSE, CAPILLARY: Glucose-Capillary: 123 mg/dL — ABNORMAL HIGH (ref 70–99)

## 2023-05-06 MED ORDER — PROMETHAZINE HCL 12.5 MG PO TABS: 12.5000 mg | ORAL_TABLET | Freq: Four times a day (QID) | ORAL | 0 refills | Status: AC | PRN

## 2023-05-06 MED ORDER — TRAMADOL HCL 50 MG PO TABS: 50.0000 mg | ORAL_TABLET | Freq: Four times a day (QID) | ORAL | 0 refills | Status: AC | PRN

## 2023-05-06 MED ORDER — ASPIRIN 81 MG PO CHEW: 81.0000 mg | CHEWABLE_TABLET | Freq: Two times a day (BID) | ORAL | 0 refills | Status: AC

## 2023-05-06 NOTE — Progress Notes (Signed)
PT TX NOTE  05/06/23 1500  PT Visit Information  Last PT Received On 05/06/23  Assistance Needed Emphasis on bed mobility - assisted back to bed with min-mod assist and incr time. Pt unable to use gait belt to self assist, difficulty d/t body habitus. Worked on quad activation/RLE  HEP. Continue PT POC   History of Present Illness 82 yo female presents to therapy s/p R THA, anterior approach on 05/05/2023 due to failure of conservative measures. During recover in PACU blood pressure of 74/36. Blood pressure was rechecked and read 79/56 and albumin infusion was ordered and given. Pt blood glucose levels deceased to 50 mgDL, pt symptomatic and dextrose injection administered. Pt PMH includes but is not limited to: HTN, DOE, angina, L THA, DM II, GERD, IBS, hiatal hernia and L shoulder surgery.  Subjective Data  Patient Stated Goal to be able to walk my daughters dogs and increase walking mins to 150 a wk and no R hip pain  Precautions  Precautions Fall  Restrictions  Weight Bearing Restrictions No  Pain Assessment  Pain Assessment 0-10  Pain Score 5  Pain Location R hip and leg  Pain Descriptors / Indicators Aching;Constant;Discomfort;Restless;Operative site guarding  Pain Intervention(s) Limited activity within patient's tolerance;Monitored during session;Premedicated before session;Repositioned  Cognition  Arousal/Alertness Awake/alert  Behavior During Therapy WFL for tasks assessed/performed  Overall Cognitive Status Within Functional Limits for tasks assessed  Ambulation/Gait  General Gait Details amb back to bed with NT  Balance  Overall balance assessment Needs assistance  Sitting-balance support Feet supported  Sitting balance-Leahy Scale Fair  Standing balance support Bilateral upper extremity supported;During functional activity;Reliant on assistive device for balance  Standing balance-Leahy Scale Poor  Exercises  Exercises Total Joint  Total Joint Exercises  Ankle  Circles/Pumps AROM;Both;20 reps  Short Arc Quad AROM;Right;10 reps  Heel Slides AROM;Right;10 reps  PT - End of Session  Equipment Utilized During Treatment Gait belt  Activity Tolerance Patient limited by pain  Patient left with call bell/phone within reach;in bed;with bed alarm set  Nurse Communication Mobility status;Patient requests pain meds   PT - Assessment/Plan  PT Plan Current plan remains appropriate  PT Visit Diagnosis Unsteadiness on feet (R26.81);Other abnormalities of gait and mobility (R26.89);Muscle weakness (generalized) (M62.81);Pain  Pain - Right/Left Right  Pain - part of body Leg;Hip  PT Frequency (ACUTE ONLY) 7X/week  Follow Up Recommendations Follow physician's recommendations for discharge plan and follow up therapies  Assistance recommended at discharge Intermittent Supervision/Assistance  Patient can return home with the following A little help with walking and/or transfers;A little help with bathing/dressing/bathroom;Assistance with cooking/housework;Assist for transportation;Help with stairs or ramp for entrance  PT equipment Rolling walker (2 wheels)  AM-PAC PT "6 Clicks" Mobility Outcome Measure (Version 2)  Help needed turning from your back to your side while in a flat bed without using bedrails? 3  Help needed moving from lying on your back to sitting on the side of a flat bed without using bedrails? 3  Help needed moving to and from a bed to a chair (including a wheelchair)? 3  Help needed standing up from a chair using your arms (e.g., wheelchair or bedside chair)? 3  Help needed to walk in hospital room? 3  Help needed climbing 3-5 steps with a railing?  1  6 Click Score 16  Consider Recommendation of Discharge To: [facility] with [facility]  Progressive Mobility  What is the highest level of mobility based on the progressive mobility assessment? Level 5 (Walks  with assist in room/hall) - Balance while stepping forward/back and can walk in room with assist - Complete   PT Goal Progression  Progress towards PT goals Progressing toward goals  Acute Rehab PT Goals  PT Goal Formulation With patient  Time For Goal Achievement 05/19/23  Potential to Achieve Goals Good  PT Time Calculation  PT Start Time (ACUTE ONLY) 1445  PT Stop Time (ACUTE ONLY) 1453  PT Time Calculation (min) (ACUTE ONLY) 8 min  PT General Charges  $$ ACUTE PT VISIT 1 Visit  PT Treatments  $Therapeutic Exercise 8-22 mins

## 2023-05-06 NOTE — Progress Notes (Signed)
Physical Therapy Treatment Patient Details Name: Robin Vaughn MRN: [id number] DOB: 01/21/1941 Today's Date: 05/06/2023   History of Present Illness 82 yo female presents to therapy s/p R THA, anterior approach on 05/05/2023 due to failure of conservative measures. During recover in PACU blood pressure of 74/36. Blood pressure was rechecked and read 79/56 and albumin infusion was ordered and given. Pt blood glucose levels deceased to 50 mgDL, pt symptomatic and dextrose injection administered. Pt PMH includes but is not limited to: HTN, DOE, angina, L THA, DM II, GERD, IBS, hiatal hernia and L shoulder surgery.    PT Comments    Pt progressing toward goals, still having some issues with pain and requiring overall min assist with transfers and gait. Will see how pt progresses this afternoon; pt wants to d/c home if possible   Recommendations for follow up therapy are one component of a multi-disciplinary discharge planning process, led by the attending physician.  Recommendations may be updated based on patient status, additional functional criteria and insurance authorization.  Follow Up Recommendations       Assistance Recommended at Discharge Intermittent Supervision/Assistance  Patient can return home with the following A little help with walking and/or transfers;A little help with bathing/dressing/bathroom;Assistance with cooking/housework;Assist for transportation;Help with stairs or ramp for entrance   Equipment Recommendations  Rolling walker (2 wheels)    Recommendations for Other Services       Precautions / Restrictions Precautions Precautions: Fall Restrictions Weight Bearing Restrictions: No     Mobility  Bed Mobility               General bed mobility comments: pt in recliner on arrival    Transfers Overall transfer level: Needs assistance Equipment used: Rolling walker (2 wheels) Transfers: Sit to/from Stand Sit to Stand: Min assist            General transfer comment: cues for hand placement and to power up with LEs; incr time, assist to rise and transition to RW from recliner and toilet    Ambulation/Gait Ambulation/Gait assistance: Min assist Gait Distance (Feet): 15 Feet (x2) Assistive device: Rolling walker (2 wheels) Gait Pattern/deviations: Step-to pattern, Shuffle, Trunk flexed, Antalgic Gait velocity: decreased     General Gait Details: incr time needed, assist intermittently to advance RLE, pt vaulting on LLE to allow swing through on R   Stairs             Wheelchair Mobility    Modified Rankin (Stroke Patients Only)       Balance Overall balance assessment: Needs assistance Sitting-balance support: Feet supported Sitting balance-Leahy Scale: Fair     Standing balance support: Bilateral upper extremity supported, During functional activity, Reliant on assistive device for balance Standing balance-Leahy Scale: Poor                              Cognition Arousal/Alertness: Awake/alert Behavior During Therapy: WFL for tasks assessed/performed Overall Cognitive Status: Within Functional Limits for tasks assessed                                          Exercises Total Joint Exercises Ankle Circles/Pumps: AROM, Both, 20 reps Heel Slides: AROM, Right, 10 reps    General Comments        Pertinent Vitals/Pain Pain Assessment Pain Assessment: 0-10 Pain Score: 4  Pain Location: R hip and leg Pain Descriptors / Indicators: Aching, Constant, Discomfort, Restless, Operative site guarding Pain Intervention(s): Limited activity within patient's tolerance, Monitored during session, Repositioned, Premedicated before session, Ice applied    Home Living                          Prior Function            PT Goals (current goals can now be found in the care plan section) Acute Rehab PT Goals Patient Stated Goal: to be able to walk my daughters dogs and  increase walking mins to 150 a wk and no R hip pain PT Goal Formulation: With patient Time For Goal Achievement: 05/19/23 Potential to Achieve Goals: Good Progress towards PT goals: Progressing toward goals    Frequency    7X/week      PT Plan Current plan remains appropriate    Co-evaluation              AM-PAC PT "6 Clicks" Mobility   Outcome Measure  Help needed turning from your back to your side while in a flat bed without using bedrails?: A Little Help needed moving from lying on your back to sitting on the side of a flat bed without using bedrails?: A Little Help needed moving to and from a bed to a chair (including a wheelchair)?: A Little Help needed standing up from a chair using your arms (e.g., wheelchair or bedside chair)?: A Little Help needed to walk in hospital room?: A Little Help needed climbing 3-5 steps with a railing? : Total 6 Click Score: 16    End of Session Equipment Utilized During Treatment: Gait belt Activity Tolerance: Patient limited by pain Patient left: in chair;with call bell/phone within reach;with nursing/sitter in room;with chair alarm set Nurse Communication: Mobility status;Patient requests pain meds PT Visit Diagnosis: Unsteadiness on feet (R26.81);Other abnormalities of gait and mobility (R26.89);Muscle weakness (generalized) (M62.81);Pain Pain - Right/Left: Right Pain - part of body: Leg;Hip     Time: 4098-1191 PT Time Calculation (min) (ACUTE ONLY): 16 min  Charges:  $Gait Training: 8-22 mins                     Delice Bison, PT  Acute Rehab Dept [facility]) [phone]  05/06/2023    [facility] 05/06/2023, 11:53 AM

## 2023-05-06 NOTE — Discharge Instructions (Signed)

## 2023-05-06 NOTE — TOC Transition Note (Signed)
Transition of Care [facility]) - CM/SW Discharge Note   Patient Details  Name: KHARI LETT MRN: [id number] Date of Birth: 17-Feb-1941  Transition of Care [facility]) CM/SW Contact:  Amada Jupiter, LCSW Phone Number: 05/06/2023, 9:09 AM   Clinical Narrative:     Met with pt who reports she does need a RW and no DME agency preference - order placed with [employer] for delivery to room today.  HHPT prearranged with [facility] - info on AVS.  No further TOC needs.  Final next level of care: Home w Home Health Services Barriers to Discharge: No Barriers Identified   Patient Goals and CMS Choice      Discharge Placement                         Discharge Plan and Services Additional resources added to the After Visit Summary for                  DME Arranged: Walker rolling DME Agency: [employer] Date DME Agency Contacted: 05/06/23 Time DME Agency Contacted: [phone] Representative spoke with at DME Agency: Leavy Cella HH Arranged: PT HH Agency: [facility]        Social Determinants of Health (SDOH) Interventions SDOH Screenings   Food Insecurity: No Food Insecurity (05/05/2023)  Housing: Low Risk  (05/05/2023)  Transportation Needs: No Transportation Needs (05/05/2023)  Utilities: Not At Risk (05/05/2023)  Tobacco Use: Low Risk  (05/05/2023)     Readmission Risk Interventions     No data to display

## 2023-05-06 NOTE — Progress Notes (Addendum)
Physical Therapy Treatment Patient Details Name: Robin Vaughn MRN: [id number] DOB: 28-May-1941 Today's Date: 05/06/2023   History of Present Illness 82 yo female presents to therapy s/p R THA, anterior approach on 05/05/2023 due to failure of conservative measures. During recover in PACU blood pressure of 74/36. Blood pressure was rechecked and read 79/56 and albumin infusion was ordered and given. Pt blood glucose levels deceased to 50 mgDL, pt symptomatic and dextrose injection administered. Pt PMH includes but is not limited to: HTN, DOE, angina, L THA, DM II, GERD, IBS, hiatal hernia and L shoulder surgery.    PT Comments    Progressing toward goals although effortful gait with decr quad activation and some R knee buckling noted during gait. Will likely benefit from another day to work on independence and safety with mobility prior to d/c    Recommendations for follow up therapy are one component of a multi-disciplinary discharge planning process, led by the attending physician.  Recommendations may be updated based on patient status, additional functional criteria and insurance authorization.  Follow Up Recommendations       Assistance Recommended at Discharge Intermittent Supervision/Assistance  Patient can return home with the following A little help with walking and/or transfers;A little help with bathing/dressing/bathroom;Assistance with cooking/housework;Assist for transportation;Help with stairs or ramp for entrance   Equipment Recommendations  Rolling walker (2 wheels)    Recommendations for Other Services       Precautions / Restrictions Precautions Precautions: Fall Restrictions Weight Bearing Restrictions: No     Mobility  Bed Mobility               General bed mobility comments: pt in recliner on arrival    Transfers Overall transfer level: Needs assistance Equipment used: Rolling walker (2 wheels) Transfers: Sit to/from Stand Sit to Stand: Min  assist           General transfer comment: cues for hand placement and to power up with LEs; incr time, assist to rise and transition to RW from recliner and toilet    Ambulation/Gait Ambulation/Gait assistance: Min assist Gait Distance (Feet): 15 Feet Assistive device: Rolling walker (2 wheels) Gait Pattern/deviations: Step-to pattern, Shuffle, Trunk flexed, Antalgic Gait velocity: decreased     General Gait Details: incr time needed, assist intermittently to advance RLE, pt vaulting on LLE to allow swing through on R   Stairs             Wheelchair Mobility    Modified Rankin (Stroke Patients Only)       Balance Overall balance assessment: Needs assistance Sitting-balance support: Feet supported Sitting balance-Leahy Scale: Fair     Standing balance support: Bilateral upper extremity supported, During functional activity, Reliant on assistive device for balance Standing balance-Leahy Scale: Poor                              Cognition Arousal/Alertness: Awake/alert Behavior During Therapy: WFL for tasks assessed/performed Overall Cognitive Status: Within Functional Limits for tasks assessed                                          Exercises   General Comments        Pertinent Vitals/Pain Pain Assessment Pain Assessment: 0-10 Pain Score: 5  Pain Location: R hip and leg Pain Descriptors / Indicators: Aching, Constant, Discomfort, Restless,  Operative site guarding Pain Intervention(s): Limited activity within patient's tolerance, Monitored during session, Premedicated before session, Repositioned    Home Living                          Prior Function            PT Goals (current goals can now be found in the care plan section) Acute Rehab PT Goals Patient Stated Goal: to be able to walk my daughters dogs and increase walking mins to 150 a wk and no R hip pain PT Goal Formulation: With patient Time For  Goal Achievement: 05/19/23 Potential to Achieve Goals: Good Progress towards PT goals: Progressing toward goals    Frequency    7X/week      PT Plan Current plan remains appropriate    Co-evaluation              AM-PAC PT "6 Clicks" Mobility   Outcome Measure  Help needed turning from your back to your side while in a flat bed without using bedrails?: A Little Help needed moving from lying on your back to sitting on the side of a flat bed without using bedrails?: A Little Help needed moving to and from a bed to a chair (including a wheelchair)?: A Little Help needed standing up from a chair using your arms (e.g., wheelchair or bedside chair)?: A Little Help needed to walk in hospital room?: A Little Help needed climbing 3-5 steps with a railing? : Total 6 Click Score: 16    End of Session Equipment Utilized During Treatment: Gait belt Activity Tolerance: Patient limited by pain Patient left: with call bell/phone in bathroom Nurse Communication: Mobility status;Patient requests pain meds PT Visit Diagnosis: Unsteadiness on feet (R26.81);Other abnormalities of gait and mobility (R26.89);Muscle weakness (generalized) (M62.81);Pain Pain - Right/Left: Right Pain - part of body: Leg;Hip     Time: 6578-4696 PT Time Calculation (min) (ACUTE ONLY): 13 min  Charges:  $Gait Training: 8-22 mins                     Delice Bison, PT  Acute Rehab Dept ([facility]) [phone]  05/06/2023    [facility] 05/06/2023, 2:58 PM

## 2023-05-06 NOTE — Progress Notes (Signed)
Subjective: 1 Day Post-Op Procedure(s) (LRB): RIGHT TOTAL HIP ARTHROPLASTY ANTERIOR APPROACH (Right) Patient reports pain as moderate.    Objective: Vital signs in last 24 hours: Temp:  [98 F (36.7 C)-98.6 F (37 C)] 98.5 F (36.9 C) (06/22 0953) Pulse Rate:  [73-96] 74 (06/22 0953) Resp:  [13-20] 18 (06/22 0953) BP: (74-124)/(36-79) 112/56 (06/22 0953) SpO2:  [94 %-100 %] 97 % (06/22 0953)  Intake/Output from previous day: 06/21 0701 - 06/22 0700 In: 2767.5 [P.O.:660; I.V.:1857.5; IV Piggyback:250] Out: 2750 [Urine:2600; Blood:150] Intake/Output this shift: Total I/O In: 480 [P.O.:480] Out: -   Recent Labs    05/06/23 0342  HGB 10.1*   Recent Labs    05/06/23 0342  WBC 10.0  RBC 3.55*  HCT 31.6*  PLT 141*   Recent Labs    05/06/23 0342  NA 133*  K 4.9  CL 105  CO2 22  BUN 16  CREATININE 0.78  GLUCOSE 189*  CALCIUM 8.4*   No results for input(s): "LABPT", "INR" in the last 72 hours.  Sensation intact distally Intact pulses distally Dorsiflexion/Plantar flexion intact Incision: dressing C/D/I   Assessment/Plan: 1 Day Post-Op Procedure(s) (LRB): RIGHT TOTAL HIP ARTHROPLASTY ANTERIOR APPROACH (Right) Up with therapy Discharge home with home health this afternoon, but only if clears therapy.      [name] 05/06/2023, 11:16 AM

## 2023-05-06 NOTE — Plan of Care (Signed)
  Problem: Education: Goal: Knowledge of the prescribed therapeutic regimen will improve Outcome: Progressing   Problem: Pain Management: Goal: Pain level will decrease with appropriate interventions Outcome: Progressing   Problem: Pain Management: Goal: Pain level will decrease with appropriate interventions Outcome: Progressing     

## 2023-05-06 NOTE — Plan of Care (Signed)
  Problem: Education: Goal: Knowledge of the prescribed therapeutic regimen will improve Outcome: Progressing Goal: Understanding of discharge needs will improve Outcome: Progressing   Problem: Pain Management: Goal: Pain level will decrease with appropriate interventions Outcome: Progressing   Problem: Nutrition: Goal: Adequate nutrition will be maintained Outcome: Progressing   

## 2023-05-07 DIAGNOSIS — Z7984 Long term (current) use of oral hypoglycemic drugs: Secondary | ICD-10-CM | POA: Diagnosis not present

## 2023-05-07 DIAGNOSIS — Z96642 Presence of left artificial hip joint: Secondary | ICD-10-CM | POA: Diagnosis not present

## 2023-05-07 DIAGNOSIS — Z79899 Other long term (current) drug therapy: Secondary | ICD-10-CM | POA: Diagnosis not present

## 2023-05-07 DIAGNOSIS — M1611 Unilateral primary osteoarthritis, right hip: Secondary | ICD-10-CM | POA: Diagnosis not present

## 2023-05-07 DIAGNOSIS — E119 Type 2 diabetes mellitus without complications: Secondary | ICD-10-CM | POA: Diagnosis not present

## 2023-05-07 DIAGNOSIS — I10 Essential (primary) hypertension: Secondary | ICD-10-CM | POA: Diagnosis not present

## 2023-05-07 NOTE — Plan of Care (Signed)
Problem: Education: Goal: Knowledge of the prescribed therapeutic regimen will improve Outcome: Adequate for Discharge Goal: Understanding of discharge needs will improve Outcome: Adequate for Discharge Goal: Individualized Educational Video(s) Outcome: Adequate for Discharge   Problem: Activity: Goal: Ability to avoid complications of mobility impairment will improve Outcome: Adequate for Discharge Goal: Ability to tolerate increased activity will improve Outcome: Adequate for Discharge   Problem: Clinical Measurements: Goal: Postoperative complications will be avoided or minimized Outcome: Adequate for Discharge   Problem: Pain Management: Goal: Pain level will decrease with appropriate interventions Outcome: Adequate for Discharge   Problem: Skin Integrity: Goal: Will show signs of wound healing Outcome: Adequate for Discharge   Problem: Education: Goal: Knowledge of General Education information will improve Description: Including pain rating scale, medication(s)/side effects and non-pharmacologic comfort measures Outcome: Adequate for Discharge   Problem: Health Behavior/Discharge Planning: Goal: Ability to manage health-related needs will improve Outcome: Adequate for Discharge   Problem: Clinical Measurements: Goal: Ability to maintain clinical measurements within normal limits will improve Outcome: Adequate for Discharge Goal: Will remain free from infection Outcome: Adequate for Discharge Goal: Diagnostic test results will improve Outcome: Adequate for Discharge Goal: Respiratory complications will improve Outcome: Adequate for Discharge Goal: Cardiovascular complication will be avoided Outcome: Adequate for Discharge   Problem: Activity: Goal: Risk for activity intolerance will decrease Outcome: Adequate for Discharge   Problem: Nutrition: Goal: Adequate nutrition will be maintained Outcome: Adequate for Discharge   Problem: Coping: Goal: Level of  anxiety will decrease Outcome: Adequate for Discharge   Problem: Elimination: Goal: Will not experience complications related to bowel motility Outcome: Adequate for Discharge Goal: Will not experience complications related to urinary retention Outcome: Adequate for Discharge   Problem: Pain Managment: Goal: General experience of comfort will improve Outcome: Adequate for Discharge   Problem: Safety: Goal: Ability to remain free from injury will improve Outcome: Adequate for Discharge   Problem: Skin Integrity: Goal: Risk for impaired skin integrity will decrease Outcome: Adequate for Discharge   Problem: Education: Goal: Knowledge of the prescribed therapeutic regimen will improve Outcome: Adequate for Discharge Goal: Understanding of discharge needs will improve Outcome: Adequate for Discharge Goal: Individualized Educational Video(s) Outcome: Adequate for Discharge   Problem: Activity: Goal: Ability to avoid complications of mobility impairment will improve Outcome: Adequate for Discharge Goal: Ability to tolerate increased activity will improve Outcome: Adequate for Discharge   Problem: Clinical Measurements: Goal: Postoperative complications will be avoided or minimized Outcome: Adequate for Discharge   Problem: Pain Management: Goal: Pain level will decrease with appropriate interventions Outcome: Adequate for Discharge   Problem: Skin Integrity: Goal: Will show signs of wound healing Outcome: Adequate for Discharge   Problem: Education: Goal: Knowledge of General Education information will improve Description: Including pain rating scale, medication(s)/side effects and non-pharmacologic comfort measures Outcome: Adequate for Discharge   Problem: Health Behavior/Discharge Planning: Goal: Ability to manage health-related needs will improve Outcome: Adequate for Discharge   Problem: Clinical Measurements: Goal: Ability to maintain clinical measurements  within normal limits will improve Outcome: Adequate for Discharge Goal: Will remain free from infection Outcome: Adequate for Discharge Goal: Diagnostic test results will improve Outcome: Adequate for Discharge Goal: Respiratory complications will improve Outcome: Adequate for Discharge Goal: Cardiovascular complication will be avoided Outcome: Adequate for Discharge   Problem: Activity: Goal: Risk for activity intolerance will decrease Outcome: Adequate for Discharge   Problem: Nutrition: Goal: Adequate nutrition will be maintained Outcome: Adequate for Discharge   Problem: Coping: Goal: Level of anxiety  will decrease Outcome: Adequate for Discharge   Problem: Elimination: Goal: Will not experience complications related to bowel motility Outcome: Adequate for Discharge Goal: Will not experience complications related to urinary retention Outcome: Adequate for Discharge   Problem: Pain Managment: Goal: General experience of comfort will improve Outcome: Adequate for Discharge   Problem: Safety: Goal: Ability to remain free from injury will improve Outcome: Adequate for Discharge   Problem: Skin Integrity: Goal: Risk for impaired skin integrity will decrease Outcome: Adequate for Discharge

## 2023-05-07 NOTE — Progress Notes (Signed)
  Subjective: Patient stable.  Pain controlled.  Plans for discharge today   Objective: Vital signs in last 24 hours: Temp:  [98.4 F (36.9 C)-99.3 F (37.4 C)] 99.1 F (37.3 C) (06/23 0631) Pulse Rate:  [74-89] 89 (06/23 0631) Resp:  [17-18] 17 (06/23 0631) BP: (112-132)/(54-66) 125/66 (06/23 0631) SpO2:  [97 %-100 %] 100 % (06/23 0631)  Intake/Output from previous day: 06/22 0701 - 06/23 0700 In: 960 [P.O.:960] Out: 300 [Urine:300] Intake/Output this shift: Total I/O In: 120 [P.O.:120] Out: -   Exam:  Intact pulses distally Dorsiflexion/Plantar flexion intact  Labs: Recent Labs    05/06/23 0342  HGB 10.1*   Recent Labs    05/06/23 0342  WBC 10.0  RBC 3.55*  HCT 31.6*  PLT 141*   Recent Labs    05/06/23 0342  NA 133*  K 4.9  CL 105  CO2 22  BUN 16  CREATININE 0.78  GLUCOSE 189*  CALCIUM 8.4*   No results for input(s): "LABPT", "INR" in the last 72 hours.  Assessment/Plan: Plan at this time is discharge to home.  Patient appears very comfortable sitting in the chair this morning.  Did well with therapy yesterday.  No dizziness.   Robin Vaughn 05/07/2023, 8:59 AM

## 2023-05-07 NOTE — Progress Notes (Signed)
Physical Therapy Treatment Patient Details Name: Robin Vaughn MRN: [id number] DOB: Sep 05, 1941 Today's Date: 05/07/2023   History of Present Illness 82 yo female presents to therapy s/p R THA, anterior approach on 05/05/2023 due to failure of conservative measures. PMH includes but is not limited to: HTN, DOE, angina, L THA, DM II, GERD, IBS, hiatal hernia and L shoulder surgery.    PT Comments    Pt progressing well, improved quad activation and wt shift to RLE during gait today.  Able to ascend/descend stairs with min/guard. Pt feels ready to d/c today.  Recommendations for follow up therapy are one component of a multi-disciplinary discharge planning process, led by the attending physician.  Recommendations may be updated based on patient status, additional functional criteria and insurance authorization.  Follow Up Recommendations       Assistance Recommended at Discharge Intermittent Supervision/Assistance  Patient can return home with the following A little help with walking and/or transfers;A little help with bathing/dressing/bathroom;Assistance with cooking/housework;Assist for transportation;Help with stairs or ramp for entrance   Equipment Recommendations  Rolling walker (2 wheels)    Recommendations for Other Services       Precautions / Restrictions Precautions Precautions: Fall Restrictions Weight Bearing Restrictions: No Other Position/Activity Restrictions: WBAT     Mobility  Bed Mobility               General bed mobility comments: pt in recliner on arrival    Transfers Overall transfer level: Needs assistance Equipment used: Rolling walker (2 wheels) Transfers: Sit to/from Stand Sit to Stand: Min guard, Supervision           General transfer comment: cues for hand placement and to power up with LEs;    Ambulation/Gait Ambulation/Gait assistance: Min guard Gait Distance (Feet): 40 Feet Assistive device: Rolling walker (2 wheels) Gait  Pattern/deviations: Step-to pattern, Decreased stance time - right Gait velocity: decreased     General Gait Details: cues for initial sequence, improving wt shift to RLE, incr quad activation with no noted knee buckling   Stairs Stairs: Yes Stairs assistance: Min guard Stair Management: Two rails, Step to pattern, Forwards Number of Stairs: 2 General stair comments: verbal cues for sequence and use of UEs to self assist   Wheelchair Mobility    Modified Rankin (Stroke Patients Only)       Balance           Standing balance support: Bilateral upper extremity supported, During functional activity, Reliant on assistive device for balance Standing balance-Leahy Scale: Poor                              Cognition Arousal/Alertness: Awake/alert Behavior During Therapy: WFL for tasks assessed/performed Overall Cognitive Status: Within Functional Limits for tasks assessed                                          Exercises Total Joint Exercises Heel Slides: AROM, Right, 10 reps Hip ABduction/ADduction: AROM, AAROM, Right, 10 reps    General Comments        Pertinent Vitals/Pain Pain Assessment Pain Assessment: 0-10 Pain Score: 4  Pain Location: R hip and leg Pain Descriptors / Indicators: Aching, Constant, Discomfort, Restless, Operative site guarding Pain Intervention(s): Limited activity within patient's tolerance, Monitored during session, Premedicated before session, Repositioned, Ice applied    Home  Living                          Prior Function            PT Goals (current goals can now be found in the care plan section) Acute Rehab PT Goals Patient Stated Goal: to be able to walk my daughters dogs and increase walking mins to 150 a wk and no R hip pain PT Goal Formulation: With patient Time For Goal Achievement: 05/19/23 Potential to Achieve Goals: Good Progress towards PT goals: Progressing toward goals     Frequency    7X/week      PT Plan Current plan remains appropriate;Other (comment) (ready for d/c)    Co-evaluation              AM-PAC PT "6 Clicks" Mobility   Outcome Measure  Help needed turning from your back to your side while in a flat bed without using bedrails?: A Little Help needed moving from lying on your back to sitting on the side of a flat bed without using bedrails?: A Little Help needed moving to and from a bed to a chair (including a wheelchair)?: A Little Help needed standing up from a chair using your arms (e.g., wheelchair or bedside chair)?: A Little Help needed to walk in hospital room?: A Little Help needed climbing 3-5 steps with a railing? : A Little 6 Click Score: 18    End of Session Equipment Utilized During Treatment: Gait belt Activity Tolerance: Patient tolerated treatment well Patient left: in chair;with call bell/phone within reach;with chair alarm set Nurse Communication: Mobility status PT Visit Diagnosis: Unsteadiness on feet (R26.81);Other abnormalities of gait and mobility (R26.89);Muscle weakness (generalized) (M62.81);Pain Pain - Right/Left: Right Pain - part of body: Leg;Hip     Time: 5284-1324 PT Time Calculation (min) (ACUTE ONLY): 20 min  Charges:  $Gait Training: 8-22 mins                     Delice Bison, PT  Acute Rehab Dept ([facility]) [phone]  05/07/2023    [facility] 05/07/2023, 10:14 AM

## 2023-05-07 NOTE — Discharge Summary (Signed)
Patient ID: Robin Vaughn MRN: [id number] DOB/AGE: 1941-05-02 82 y.o.  Admit date: 05/05/2023 Discharge date: 05/07/2023  Admission Diagnoses:  Principal Problem:   Status post total replacement of left hip Active Problems:   Unilateral primary osteoarthritis, right hip   Status post total replacement of right hip   Discharge Diagnoses:  Same  Past Medical History:  Diagnosis Date   Arthritis    Atypical chest pain    Nuc/GXT 05/02/2006--Post Ejection Fraction 83%---rare PVC's with stress   Bronchitis    Complication of anesthesia    Pt BP dropped during hernia surgery at age 49. Pt stated "I almost died"   Diabetes mellitus without complication (HCC)    Type 2   Diarrhea    D/t metformin   Exertional dyspnea    Family history of adverse reaction to anesthesia    Pts cousin was given anesthesia and he could not speak after anesthesia, although he was conscious   Frequency of urination    GERD (gastroesophageal reflux disease)    History of nephritis    Hypertension    IBS (irritable bowel syndrome)    Hx of   Pneumonia     Surgeries: Procedure(s): RIGHT TOTAL HIP ARTHROPLASTY ANTERIOR APPROACH on 05/05/2023   Consultants:   Discharged Condition: Improved  Hospital Course: Robin Vaughn is an 82 y.o. female who was admitted 05/05/2023 for operative treatment ofStatus post total replacement of left hip. Patient has severe unremitting pain that affects sleep, daily activities, and work/hobbies. After pre-op clearance the patient was taken to the operating room on 05/05/2023 and underwent  Procedure(s): RIGHT TOTAL HIP ARTHROPLASTY ANTERIOR APPROACH.    Patient was given perioperative antibiotics:  Anti-infectives (From admission, onward)    Start     Dose/Rate Route Frequency Ordered Stop   05/05/23 1800  ceFAZolin (ANCEF) IVPB 1 g/50 mL premix        1 g 100 mL/hr over 30 Minutes Intravenous Every 6 hours 05/05/23 1552 05/06/23 1731   05/05/23 0915  ceFAZolin  (ANCEF) IVPB 2g/100 mL premix        2 g 200 mL/hr over 30 Minutes Intravenous On call to O.R. 05/05/23 0900 05/05/23 1200        Patient was given sequential compression devices, early ambulation, and chemoprophylaxis to prevent DVT.  Patient benefited maximally from hospital stay and there were no complications.    Recent vital signs: Patient Vitals for the past 24 hrs:  BP Temp Temp src Pulse Resp SpO2  05/07/23 0631 125/66 99.1 F (37.3 C) Oral 89 17 100 %  05/06/23 2146 132/65 99.3 F (37.4 C) Oral 88 17 98 %     Recent laboratory studies:  Recent Labs    05/06/23 0342  WBC 10.0  HGB 10.1*  HCT 31.6*  PLT 141*  NA 133*  K 4.9  CL 105  CO2 22  BUN 16  CREATININE 0.78  GLUCOSE 189*  CALCIUM 8.4*     Discharge Medications:   Allergies as of 05/07/2023       Reactions   Penicillins Cross Reactors Other (See Comments)   Hallucinations and erythema        Medication List     TAKE these medications    amLODipine-benazepril 5-10 MG capsule Commonly known as: LOTREL TAKE ONE CAPSULE BY MOUTH ONCE DAILY What changed: when to take this   Apple Cider Vinegar 500 MG Tabs Take 500 mg by mouth 2 (two) times daily.   aspirin 81  MG chewable tablet Chew 1 tablet (81 mg total) by mouth 2 (two) times daily.   Co Q-10 200 MG Caps Take 200 mg by mouth daily.   ESSENTIAL ONE DAILY PO Take 1 tablet by mouth daily.   EYE SUPPORT PO Take 1 capsule by mouth 2 (two) times a week. Ultimate eye support   AIRBORNE PO Take 1 tablet by mouth daily. In Water   FISH OIL PO Take 1,400 mg by mouth daily. Wild [place] fish oil   HYALURONIC ACID PO Take 1 capsule by mouth daily. Ultima   LEG VEIN & CIRCULATION PO Take 1 tablet by mouth daily.   metFORMIN 500 MG tablet Commonly known as: GLUCOPHAGE Take 500 mg by mouth 3 (three) times daily.   OVER THE COUNTER MEDICATION Take 1 capsule by mouth daily. Dermal repair complex reviews   OVER THE COUNTER  MEDICATION Take 1 capsule by mouth 2 (two) times a week. Liver antidote extract   OVER THE COUNTER MEDICATION Take 1 capsule by mouth 2 (two) times a week. liver antidote extract   PROBIOTIC PO Take 1 capsule by mouth 3 (three) times a week. Ultimate friendly flora 25 billion cfu   promethazine 12.5 MG tablet Commonly known as: PHENERGAN Take 1 tablet (12.5 mg total) by mouth every 6 (six) hours as needed for nausea or vomiting.   Resveratrol 100 MG Caps Take 100 mg by mouth daily.   Systane Ultra 0.4-0.3 % Soln Generic drug: Polyethyl Glycol-Propyl Glycol Place 1 drop into both eyes at bedtime.   traMADol 50 MG tablet Commonly known as: ULTRAM Take 1-2 tablets (50-100 mg total) by mouth every 6 (six) hours as needed for moderate pain.   Evaristo Bury FlexTouch 200 UNIT/ML FlexTouch Pen Generic drug: insulin degludec Inject 22 Units into the skin daily.   Turmeric 500 MG Tabs Take 500 mg by mouth daily. Black Pepper   vitamin C 250 MG tablet Commonly known as: ASCORBIC ACID Take 500 mg by mouth daily. Gummy   Vitamin D3 50 MCG (2000 UT) capsule Take 4,000 Units by mouth daily.        Diagnostic Studies: DG Pelvis Portable  Result Date: 05/05/2023 CLINICAL DATA:  Status post right total hip arthroplasty EXAM: PORTABLE PELVIS 1-2 VIEWS COMPARISON:  02/23/2023 FINDINGS: Postoperative changes from right total hip arthroplasty noted. There are no signs of arthroplasty device loosening or infection. Stable appearance of previous left hip arthroplasty of ice. IMPRESSION: Status post right total hip arthroplasty. No postoperative complication identified. Electronically Signed   By: Signa Kell M.D.   On: 05/05/2023 14:23   DG HIP UNILAT WITH PELVIS 2-3 VIEWS RIGHT  Result Date: 05/05/2023 CLINICAL DATA:  Elective surgery. EXAM: DG HIP (WITH OR WITHOUT PELVIS) 2-3V RIGHT COMPARISON:  None Available. FINDINGS: Three fluoroscopic spot views of the pelvis and right hip obtained in  the operating room. Images during hip arthroplasty. Fluoroscopy time 13 seconds. Dose 1.3687 mGy. IMPRESSION: Intraoperative fluoroscopy during right hip arthroplasty. Electronically Signed   By: Narda Rutherford M.D.   On: 05/05/2023 13:14   DG C-Arm 1-60 Min-No Report  Result Date: 05/05/2023 Fluoroscopy was utilized by the requesting physician.  No radiographic interpretation.    Disposition: Discharge disposition: 01-Home or Self Care       Discharge Instructions     Call MD / Call 911   Complete by: As directed    If you experience chest pain or shortness of breath, CALL 911 and be transported to the hospital  emergency room.  If you develope a fever above 101 F, pus (white drainage) or increased drainage or redness at the wound, or calf pain, call your surgeon's office.   Constipation Prevention   Complete by: As directed    Drink plenty of fluids.  Prune juice may be helpful.  You may use a stool softener, such as Colace (over the counter) 100 mg twice a day.  Use MiraLax (over the counter) for constipation as needed.   Diet - low sodium heart healthy   Complete by: As directed    Increase activity slowly as tolerated   Complete by: As directed    Post-operative opioid taper instructions:   Complete by: As directed    POST-OPERATIVE OPIOID TAPER INSTRUCTIONS: It is important to wean off of your opioid medication as soon as possible. If you do not need pain medication after your surgery it is ok to stop day one. Opioids include: Codeine, Hydrocodone(Norco, Vicodin), Oxycodone(Percocet, oxycontin) and hydromorphone amongst others.  Long term and even short term use of opiods can cause: Increased pain response Dependence Constipation Depression Respiratory depression And more.  Withdrawal symptoms can include Flu like symptoms Nausea, vomiting And more Techniques to manage these symptoms Hydrate well Eat regular healthy meals Stay active Use relaxation techniques(deep  breathing, meditating, yoga) Do Not substitute Alcohol to help with tapering If you have been on opioids for less than two weeks and do not have pain than it is ok to stop all together.  Plan to wean off of opioids This plan should start within one week post op of your joint replacement. Maintain the same interval or time between taking each dose and first decrease the dose.  Cut the total daily intake of opioids by one tablet each day Next start to increase the time between doses. The last dose that should be eliminated is the evening dose.           Follow-up Information     [name], MD Follow up in 2 week(s).   Specialty: Orthopedic Surgery Contact information: [address] [place] [address] [phone]         [facility] Follow up.   Why: [facility] to provide home physical therapy visits Contact information: [address] [place] [address] [phone]                  Signed: [name] 05/07/2023, 8:18 PM

## 2023-05-08 ENCOUNTER — Telehealth: Payer: Self-pay | Admitting: *Deleted

## 2023-05-08 ENCOUNTER — Encounter (HOSPITAL_COMMUNITY): Payer: Self-pay | Admitting: Orthopaedic Surgery

## 2023-05-08 DIAGNOSIS — Z794 Long term (current) use of insulin: Secondary | ICD-10-CM | POA: Diagnosis not present

## 2023-05-08 DIAGNOSIS — Z79891 Long term (current) use of opiate analgesic: Secondary | ICD-10-CM | POA: Diagnosis not present

## 2023-05-08 DIAGNOSIS — K219 Gastro-esophageal reflux disease without esophagitis: Secondary | ICD-10-CM | POA: Diagnosis not present

## 2023-05-08 DIAGNOSIS — K589 Irritable bowel syndrome without diarrhea: Secondary | ICD-10-CM | POA: Diagnosis not present

## 2023-05-08 DIAGNOSIS — I10 Essential (primary) hypertension: Secondary | ICD-10-CM | POA: Diagnosis not present

## 2023-05-08 DIAGNOSIS — E119 Type 2 diabetes mellitus without complications: Secondary | ICD-10-CM | POA: Diagnosis not present

## 2023-05-08 DIAGNOSIS — Z96641 Presence of right artificial hip joint: Secondary | ICD-10-CM | POA: Diagnosis not present

## 2023-05-08 DIAGNOSIS — Z471 Aftercare following joint replacement surgery: Secondary | ICD-10-CM | POA: Diagnosis not present

## 2023-05-08 DIAGNOSIS — Z7984 Long term (current) use of oral hypoglycemic drugs: Secondary | ICD-10-CM | POA: Diagnosis not present

## 2023-05-08 NOTE — Telephone Encounter (Signed)
Ortho bundle D/C call completed. 

## 2023-05-10 ENCOUNTER — Telehealth: Payer: Self-pay | Admitting: Orthopaedic Surgery

## 2023-05-10 DIAGNOSIS — Z794 Long term (current) use of insulin: Secondary | ICD-10-CM | POA: Diagnosis not present

## 2023-05-10 DIAGNOSIS — Z471 Aftercare following joint replacement surgery: Secondary | ICD-10-CM | POA: Diagnosis not present

## 2023-05-10 DIAGNOSIS — K219 Gastro-esophageal reflux disease without esophagitis: Secondary | ICD-10-CM | POA: Diagnosis not present

## 2023-05-10 DIAGNOSIS — Z7984 Long term (current) use of oral hypoglycemic drugs: Secondary | ICD-10-CM | POA: Diagnosis not present

## 2023-05-10 DIAGNOSIS — Z79891 Long term (current) use of opiate analgesic: Secondary | ICD-10-CM | POA: Diagnosis not present

## 2023-05-10 DIAGNOSIS — Z96641 Presence of right artificial hip joint: Secondary | ICD-10-CM | POA: Diagnosis not present

## 2023-05-10 DIAGNOSIS — E119 Type 2 diabetes mellitus without complications: Secondary | ICD-10-CM | POA: Diagnosis not present

## 2023-05-10 DIAGNOSIS — I10 Essential (primary) hypertension: Secondary | ICD-10-CM | POA: Diagnosis not present

## 2023-05-10 DIAGNOSIS — K589 Irritable bowel syndrome without diarrhea: Secondary | ICD-10-CM | POA: Diagnosis not present

## 2023-05-10 NOTE — Telephone Encounter (Signed)
I called and talked to the daughter. Advised its ok to change bandage if needed. She stated understanding

## 2023-05-10 NOTE — Telephone Encounter (Signed)
Gretchen from [facility] called to report her Aquacell dressing is 90% soaked with bloody drainage, no sign of infection just soaked she has been concerned for the past few days. CB # [phone] call if any questions, Secure VM

## 2023-05-11 ENCOUNTER — Telehealth: Payer: Self-pay

## 2023-05-11 NOTE — Telephone Encounter (Signed)
Message on Triage line:  The patient's daughter, Hansel Starling, called because the waterproof bandage that was placed over the hip replacement incision became saturated. She said the hospital told them that in case this happens, they are sending another bandage so that it could be changed. There was no bandage given. The daughter said she is stuck in meetings in [facility] and cannot come by here to get another one. She asked if Dr. Magnus Ivan had any privileges at [facility] and if we could call there to get them get one for her to pick up. Please call Adrienne at #[phone] to advise.

## 2023-05-15 DIAGNOSIS — H9313 Tinnitus, bilateral: Secondary | ICD-10-CM | POA: Diagnosis not present

## 2023-05-15 DIAGNOSIS — H903 Sensorineural hearing loss, bilateral: Secondary | ICD-10-CM | POA: Diagnosis not present

## 2023-05-22 ENCOUNTER — Encounter: Payer: Self-pay | Admitting: Physician Assistant

## 2023-05-22 ENCOUNTER — Ambulatory Visit (INDEPENDENT_AMBULATORY_CARE_PROVIDER_SITE_OTHER): Payer: Medicare PPO | Admitting: Physician Assistant

## 2023-05-22 DIAGNOSIS — Z96641 Presence of right artificial hip joint: Secondary | ICD-10-CM

## 2023-05-22 NOTE — Progress Notes (Signed)
HPI: [name] returns today status post right total hip arthroplasty 05/04/2028.  She states she is having no pain.  She is ambulating with a walker though.  She finished with home health PT.  She is taking no pain medication.  She is no longer taking any aspirin.  Review of systems see HPI otherwise negative  Physical exam: Right hip surgical incisions healing well no signs of infection.  No wound dehiscence.  Right hip good range of motion.  Calf supple nontender dorsiflexion plantarflexion right ankle intact.  Walks with use of a walker with a slight antalgic gait.  She is able to walk about the room on her own with an antalgic gait.  Impression: Status post right total hip arthroplasty   Plan: Staples removed Steri-Strips applied.  Scar tissue mobilization encouraged.  She will continue to walk for exercise.  Questions were encouraged and answered at length.  Follow-up in 1 month.

## 2023-05-24 ENCOUNTER — Telehealth: Payer: Self-pay | Admitting: *Deleted

## 2023-05-24 NOTE — Telephone Encounter (Signed)
In office Ortho bundle visit completed 05/22/23.

## 2023-06-07 DIAGNOSIS — Z794 Long term (current) use of insulin: Secondary | ICD-10-CM | POA: Diagnosis not present

## 2023-06-07 DIAGNOSIS — N182 Chronic kidney disease, stage 2 (mild): Secondary | ICD-10-CM | POA: Diagnosis not present

## 2023-06-07 DIAGNOSIS — E1165 Type 2 diabetes mellitus with hyperglycemia: Secondary | ICD-10-CM | POA: Diagnosis not present

## 2023-06-07 DIAGNOSIS — I10 Essential (primary) hypertension: Secondary | ICD-10-CM | POA: Diagnosis not present

## 2023-06-07 DIAGNOSIS — E785 Hyperlipidemia, unspecified: Secondary | ICD-10-CM | POA: Diagnosis not present

## 2023-06-12 DIAGNOSIS — H903 Sensorineural hearing loss, bilateral: Secondary | ICD-10-CM | POA: Diagnosis not present

## 2023-06-20 ENCOUNTER — Ambulatory Visit: Payer: Medicare PPO | Admitting: Physician Assistant

## 2023-06-20 ENCOUNTER — Encounter: Payer: Self-pay | Admitting: Physician Assistant

## 2023-06-20 DIAGNOSIS — Z96641 Presence of right artificial hip joint: Secondary | ICD-10-CM

## 2023-06-20 NOTE — Progress Notes (Signed)
HPI: [name] returns today 6 weeks 4 days status post right total hip arthroplasty.  She is overall doing well.  Denies any pain in the right hip.  She is she does note that her knee pain is resolved.  She is back to driving.  She does have concerns over the incision and the fact that it is "lumpy".  Review of systems: Negative for fevers chills  Physical exam: General well-developed well-nourished female who ambulates without any assistive device and a nonantalgic gait. Right hip: Good range of motion.  Surgical incisions healing well no signs of infection.  Slight keloid.  Right calf supple nontender dorsiflexion plantarflexion right ankle intact.  Impression: Status post right total hip arthroplasty 05/05/2023  Plan: She will follow-up with [place] at 6 months postop we will obtain an AP standing pelvis and lateral view of the right hip.  She will follow-up with [place] sooner if there is any questions concerns.  Questions were encouraged and answered at length today.

## 2023-08-03 DIAGNOSIS — E785 Hyperlipidemia, unspecified: Secondary | ICD-10-CM | POA: Diagnosis not present

## 2023-08-07 DIAGNOSIS — N182 Chronic kidney disease, stage 2 (mild): Secondary | ICD-10-CM | POA: Diagnosis not present

## 2023-08-07 DIAGNOSIS — M810 Age-related osteoporosis without current pathological fracture: Secondary | ICD-10-CM | POA: Diagnosis not present

## 2023-08-07 DIAGNOSIS — E785 Hyperlipidemia, unspecified: Secondary | ICD-10-CM | POA: Diagnosis not present

## 2023-08-07 DIAGNOSIS — I129 Hypertensive chronic kidney disease with stage 1 through stage 4 chronic kidney disease, or unspecified chronic kidney disease: Secondary | ICD-10-CM | POA: Diagnosis not present

## 2023-08-07 DIAGNOSIS — Z794 Long term (current) use of insulin: Secondary | ICD-10-CM | POA: Diagnosis not present

## 2023-08-07 DIAGNOSIS — E1165 Type 2 diabetes mellitus with hyperglycemia: Secondary | ICD-10-CM | POA: Diagnosis not present

## 2023-08-10 DIAGNOSIS — R82998 Other abnormal findings in urine: Secondary | ICD-10-CM | POA: Diagnosis not present

## 2023-08-10 DIAGNOSIS — Z0000 Encounter for general adult medical examination without abnormal findings: Secondary | ICD-10-CM | POA: Diagnosis not present

## 2023-08-10 DIAGNOSIS — E119 Type 2 diabetes mellitus without complications: Secondary | ICD-10-CM | POA: Diagnosis not present

## 2023-08-10 DIAGNOSIS — I5189 Other ill-defined heart diseases: Secondary | ICD-10-CM | POA: Diagnosis not present

## 2023-08-10 DIAGNOSIS — Z23 Encounter for immunization: Secondary | ICD-10-CM | POA: Diagnosis not present

## 2023-08-10 DIAGNOSIS — Z794 Long term (current) use of insulin: Secondary | ICD-10-CM | POA: Diagnosis not present

## 2023-08-10 DIAGNOSIS — Z1331 Encounter for screening for depression: Secondary | ICD-10-CM | POA: Diagnosis not present

## 2023-08-10 DIAGNOSIS — I10 Essential (primary) hypertension: Secondary | ICD-10-CM | POA: Diagnosis not present

## 2023-08-10 DIAGNOSIS — N182 Chronic kidney disease, stage 2 (mild): Secondary | ICD-10-CM | POA: Diagnosis not present

## 2023-08-10 DIAGNOSIS — E1165 Type 2 diabetes mellitus with hyperglycemia: Secondary | ICD-10-CM | POA: Diagnosis not present

## 2023-08-10 DIAGNOSIS — M810 Age-related osteoporosis without current pathological fracture: Secondary | ICD-10-CM | POA: Diagnosis not present

## 2023-08-10 DIAGNOSIS — E785 Hyperlipidemia, unspecified: Secondary | ICD-10-CM | POA: Diagnosis not present

## 2023-09-08 ENCOUNTER — Telehealth: Payer: Self-pay | Admitting: *Deleted

## 2023-09-11 NOTE — Telephone Encounter (Signed)
Ortho bundle call completed. 

## 2023-11-02 DIAGNOSIS — I10 Essential (primary) hypertension: Secondary | ICD-10-CM | POA: Diagnosis not present

## 2023-11-02 DIAGNOSIS — Z794 Long term (current) use of insulin: Secondary | ICD-10-CM | POA: Diagnosis not present

## 2023-11-02 DIAGNOSIS — E1165 Type 2 diabetes mellitus with hyperglycemia: Secondary | ICD-10-CM | POA: Diagnosis not present

## 2023-11-02 DIAGNOSIS — E785 Hyperlipidemia, unspecified: Secondary | ICD-10-CM | POA: Diagnosis not present

## 2023-11-02 DIAGNOSIS — N182 Chronic kidney disease, stage 2 (mild): Secondary | ICD-10-CM | POA: Diagnosis not present

## 2023-11-23 ENCOUNTER — Ambulatory Visit (INDEPENDENT_AMBULATORY_CARE_PROVIDER_SITE_OTHER): Payer: Medicare PPO | Admitting: Physician Assistant

## 2023-11-23 ENCOUNTER — Encounter: Payer: Self-pay | Admitting: Physician Assistant

## 2023-11-23 ENCOUNTER — Other Ambulatory Visit (INDEPENDENT_AMBULATORY_CARE_PROVIDER_SITE_OTHER): Payer: Self-pay

## 2023-11-23 DIAGNOSIS — Z96641 Presence of right artificial hip joint: Secondary | ICD-10-CM

## 2023-11-23 NOTE — Progress Notes (Signed)
 HPI: Mrs. Cedeno returns today status post right total hip arthroplasty 05/05/2023.  She is overall doing well.  She states she has no pain in her right hip.  She has no complaints in regards to her hip.  She notes that her hip incisions healed well.  Review of systems see HPI otherwise negative  Physical exam: General Well-developed well-nourished female who ambulates without any assistive device. Psych: Alert and oriented x 3 Right hip: Good range of motion without pain.  Dorsiflexion plantarflexion right ankle intact.  Radiographs: AP pelvis lateral view of the right hip: Status post bilateral total hip arthroplasties well-seated components.  No acute fractures acute findings.  Impression: Status post right total hip arthroplasty  Plan: Recommend she continue to work on scar tissue mobilization.  Also work on range of motion strengthening both hips.  Follow-up with us  at 1 year postop for an AP pelvis lateral view of the right hip.  She will follow-up sooner if there is any questions concerns.

## 2024-01-01 ENCOUNTER — Ambulatory Visit (INDEPENDENT_AMBULATORY_CARE_PROVIDER_SITE_OTHER): Payer: Self-pay | Admitting: Audiology

## 2024-01-01 DIAGNOSIS — H903 Sensorineural hearing loss, bilateral: Secondary | ICD-10-CM

## 2024-01-02 NOTE — Progress Notes (Unsigned)
  [address], [address] [place], [place] [address] [phone]  Hearing Aid Check     Machele Deihl Noy comes for a same day appointment for a hearing aid check.   Accompanied [id number]   Right Left  Hearing aid manufacturer Oticon Intent 3 miniRITE-R NG:[id number] Oticon Intent 3 miniRITE-R XL:[id number]  Hearing aid style Receiver in the ear Receiver in the ear  Hearing aid battery rechargeable rechargeable  Receiver 2L-85 2L-85  Dome/ custom earpiece 8mm closed 8mm closed  Retention wire yes yes  Warranty expiration date 06-30-2026 06-30-2026  Loss and Damage unknown unknown  Additional accessories Expiration date Charger's expiration date: 06-30-2026  Initial fitting date 06-12-2023 06-12-2023  Device was fit at: [facility]    Chief complaint: Patient reports the hearing aids are not working well because she cannot hear well, even when she turns up the volume using the app on her phone.  Actions taken: Inspection of the device and listening check suggested it was working well. Both filters,domes and microphones were cleaned/changed.   Given that the patient did not hear any improvement and tympanograms looked good a quick air conduction and bone conduction check was completed. It showed a significant change when compared to her previous audiogram on file from ***.   250Hz  500Hz  1000Hz  2000 Hz 4000 Hz 8000 Hz  Right (dBHL) 55 45 40 55 65 65  Left (dBHL) 55 45 40 55 75 75    Services fee: $0 was paid at checkout.  Patient was oriented about ideally having real ear measurements when hearing aid settings are done. Also Dr. Suszanne Conners will be notified about the hearing changes.  Recommend: Return for a hearing aid check , as needed. Return for a hearing evaluation and to see an ENT, if concerns with hearing changes arise.    Amerah Puleo MARIE LEROUX-MARTINEZ, AUD

## 2024-01-23 DIAGNOSIS — Z794 Long term (current) use of insulin: Secondary | ICD-10-CM | POA: Diagnosis not present

## 2024-01-23 DIAGNOSIS — N182 Chronic kidney disease, stage 2 (mild): Secondary | ICD-10-CM | POA: Diagnosis not present

## 2024-01-23 DIAGNOSIS — I5189 Other ill-defined heart diseases: Secondary | ICD-10-CM | POA: Diagnosis not present

## 2024-01-23 DIAGNOSIS — R0609 Other forms of dyspnea: Secondary | ICD-10-CM | POA: Diagnosis not present

## 2024-01-23 DIAGNOSIS — E1165 Type 2 diabetes mellitus with hyperglycemia: Secondary | ICD-10-CM | POA: Diagnosis not present

## 2024-01-23 DIAGNOSIS — I872 Venous insufficiency (chronic) (peripheral): Secondary | ICD-10-CM | POA: Diagnosis not present

## 2024-01-23 DIAGNOSIS — I10 Essential (primary) hypertension: Secondary | ICD-10-CM | POA: Diagnosis not present

## 2024-01-23 DIAGNOSIS — E785 Hyperlipidemia, unspecified: Secondary | ICD-10-CM | POA: Diagnosis not present

## 2024-04-30 DIAGNOSIS — E1165 Type 2 diabetes mellitus with hyperglycemia: Secondary | ICD-10-CM | POA: Diagnosis not present

## 2024-04-30 DIAGNOSIS — I10 Essential (primary) hypertension: Secondary | ICD-10-CM | POA: Diagnosis not present

## 2024-04-30 DIAGNOSIS — E785 Hyperlipidemia, unspecified: Secondary | ICD-10-CM | POA: Diagnosis not present

## 2024-04-30 DIAGNOSIS — Z794 Long term (current) use of insulin: Secondary | ICD-10-CM | POA: Diagnosis not present

## 2024-04-30 DIAGNOSIS — N182 Chronic kidney disease, stage 2 (mild): Secondary | ICD-10-CM | POA: Diagnosis not present

## 2024-05-14 ENCOUNTER — Ambulatory Visit (INDEPENDENT_AMBULATORY_CARE_PROVIDER_SITE_OTHER): Payer: Medicare PPO | Admitting: Otolaryngology

## 2024-05-14 ENCOUNTER — Encounter (INDEPENDENT_AMBULATORY_CARE_PROVIDER_SITE_OTHER): Payer: Self-pay | Admitting: Otolaryngology

## 2024-05-14 VITALS — BP 133/74 | HR 88

## 2024-05-14 DIAGNOSIS — H9319 Tinnitus, unspecified ear: Secondary | ICD-10-CM

## 2024-05-14 DIAGNOSIS — H903 Sensorineural hearing loss, bilateral: Secondary | ICD-10-CM | POA: Diagnosis not present

## 2024-05-14 DIAGNOSIS — H9313 Tinnitus, bilateral: Secondary | ICD-10-CM

## 2024-05-15 DIAGNOSIS — H9313 Tinnitus, bilateral: Secondary | ICD-10-CM | POA: Insufficient documentation

## 2024-05-15 DIAGNOSIS — H903 Sensorineural hearing loss, bilateral: Secondary | ICD-10-CM | POA: Insufficient documentation

## 2024-05-15 NOTE — Progress Notes (Signed)
 Patient ID: Robin Vaughn, female   DOB: 11/15/1940, 83 y.o.   MRN: [id number]  Follow-up: Bilateral hearing loss, tinnitus  HPI: The patient is an 83 year old female who returns today for follow-up evaluation.  She was last seen in July 2024.  At that time, she was complaining of bilateral hearing loss and bilateral high-pitched tinnitus.  She was noted to have bilateral high-frequency sensorineural hearing loss, likely secondary to routine presbycusis.  Her tinnitus was likely a direct result of her hearing loss.  She was fitted with bilateral hearing aids.  The strategies to cope with tinnitus were also discussed.  The patient returns today reporting no significant change in her hearing.  Her tinnitus has mostly resolved.  Currently she denies any otalgia, otorrhea, or vertigo.  Exam: General: Communicates without difficulty, well nourished, no acute distress. Head: Normocephalic, no evidence injury, no tenderness, facial buttresses intact without stepoff. Face/sinus: No tenderness to palpation and percussion. Facial movement is normal and symmetric. Eyes: PERRL, EOMI. No scleral icterus, conjunctivae clear. Neuro: CN II exam reveals vision grossly intact.  No nystagmus at any point of gaze. Ears: Auricles well formed without lesions.  Ear canals are intact without mass or lesion.  No erythema or edema is appreciated.  The TMs are intact without fluid. Nose: External evaluation reveals normal support and skin without lesions.  Dorsum is intact.  Anterior rhinoscopy reveals normal mucosa over anterior aspect of inferior turbinates and intact septum.  No purulence noted. Oral:  Oral cavity and oropharynx are intact, symmetric, without erythema or edema.  Mucosa is moist without lesions. Neck: Full range of motion without pain.  There is no significant lymphadenopathy.  No masses palpable.  Thyroid bed within normal limits to palpation.  Parotid glands and submandibular glands equal bilaterally without mass.   Trachea is midline. Neuro:  CN 2-12 grossly intact.   Assessment: 1.  Subjectively stable bilateral high-frequency sensorineural hearing loss. 2.  The patient's tinnitus has mostly resolved. 3.  Her ear canals, tympanic membranes, and middle ear spaces are normal.  Plan: 1.  The physical exam findings are reviewed with the patient. 2.  Continue the use of her hearing aids. 3.  The patient will return for reevaluation in 1 year, sooner if needed.

## 2024-05-23 ENCOUNTER — Ambulatory Visit: Payer: Medicare PPO | Admitting: Physician Assistant

## 2024-08-22 DIAGNOSIS — N182 Chronic kidney disease, stage 2 (mild): Secondary | ICD-10-CM | POA: Diagnosis not present

## 2024-08-22 DIAGNOSIS — Z0189 Encounter for other specified special examinations: Secondary | ICD-10-CM | POA: Diagnosis not present

## 2024-08-22 DIAGNOSIS — M810 Age-related osteoporosis without current pathological fracture: Secondary | ICD-10-CM | POA: Diagnosis not present

## 2024-08-22 DIAGNOSIS — E1165 Type 2 diabetes mellitus with hyperglycemia: Secondary | ICD-10-CM | POA: Diagnosis not present

## 2024-08-22 DIAGNOSIS — I1310 Hypertensive heart and chronic kidney disease without heart failure, with stage 1 through stage 4 chronic kidney disease, or unspecified chronic kidney disease: Secondary | ICD-10-CM | POA: Diagnosis not present

## 2024-08-22 DIAGNOSIS — Z23 Encounter for immunization: Secondary | ICD-10-CM | POA: Diagnosis not present

## 2024-08-22 DIAGNOSIS — K219 Gastro-esophageal reflux disease without esophagitis: Secondary | ICD-10-CM | POA: Diagnosis not present

## 2024-08-22 DIAGNOSIS — E785 Hyperlipidemia, unspecified: Secondary | ICD-10-CM | POA: Diagnosis not present

## 2024-08-29 DIAGNOSIS — Z1389 Encounter for screening for other disorder: Secondary | ICD-10-CM | POA: Diagnosis not present

## 2024-08-29 DIAGNOSIS — N182 Chronic kidney disease, stage 2 (mild): Secondary | ICD-10-CM | POA: Diagnosis not present

## 2024-08-29 DIAGNOSIS — Z23 Encounter for immunization: Secondary | ICD-10-CM | POA: Diagnosis not present

## 2024-08-29 DIAGNOSIS — R82998 Other abnormal findings in urine: Secondary | ICD-10-CM | POA: Diagnosis not present

## 2024-08-29 DIAGNOSIS — I10 Essential (primary) hypertension: Secondary | ICD-10-CM | POA: Diagnosis not present

## 2024-08-29 DIAGNOSIS — E119 Type 2 diabetes mellitus without complications: Secondary | ICD-10-CM | POA: Diagnosis not present

## 2024-08-29 DIAGNOSIS — Z0000 Encounter for general adult medical examination without abnormal findings: Secondary | ICD-10-CM | POA: Diagnosis not present

## 2024-08-29 DIAGNOSIS — E785 Hyperlipidemia, unspecified: Secondary | ICD-10-CM | POA: Diagnosis not present

## 2024-08-29 DIAGNOSIS — I872 Venous insufficiency (chronic) (peripheral): Secondary | ICD-10-CM | POA: Diagnosis not present

## 2024-08-29 DIAGNOSIS — Z1331 Encounter for screening for depression: Secondary | ICD-10-CM | POA: Diagnosis not present

## 2024-08-29 DIAGNOSIS — M810 Age-related osteoporosis without current pathological fracture: Secondary | ICD-10-CM | POA: Diagnosis not present

## 2024-09-16 ENCOUNTER — Encounter: Payer: Self-pay | Admitting: Radiology
